# Patient Record
Sex: Female | Born: 1935 | Race: White | Hispanic: No | Smoking: Never smoker
Health system: Southern US, Community
[De-identification: ages and names within clinical notes are randomized; demographics above are authoritative.]

## PROBLEM LIST (undated history)

## (undated) DIAGNOSIS — M810 Age-related osteoporosis without current pathological fracture: Secondary | ICD-10-CM

## (undated) DIAGNOSIS — I071 Rheumatic tricuspid insufficiency: Secondary | ICD-10-CM

## (undated) DIAGNOSIS — I340 Nonrheumatic mitral (valve) insufficiency: Secondary | ICD-10-CM

## (undated) DIAGNOSIS — E7800 Pure hypercholesterolemia, unspecified: Secondary | ICD-10-CM

## (undated) DIAGNOSIS — M2550 Pain in unspecified joint: Secondary | ICD-10-CM

## (undated) DIAGNOSIS — I350 Nonrheumatic aortic (valve) stenosis: Secondary | ICD-10-CM

## (undated) DIAGNOSIS — I351 Nonrheumatic aortic (valve) insufficiency: Secondary | ICD-10-CM

## (undated) DIAGNOSIS — I4891 Unspecified atrial fibrillation: Secondary | ICD-10-CM

## (undated) DIAGNOSIS — I10 Essential (primary) hypertension: Secondary | ICD-10-CM

## (undated) HISTORY — DX: Pure hypercholesterolemia, unspecified: E78.00

## (undated) HISTORY — DX: Pain in unspecified joint: M25.50

## (undated) HISTORY — DX: Nonrheumatic aortic (valve) stenosis: I35.0

## (undated) HISTORY — DX: Nonrheumatic mitral (valve) insufficiency: I34.0

## (undated) HISTORY — DX: Age-related osteoporosis without current pathological fracture: M81.0

## (undated) HISTORY — DX: Unspecified atrial fibrillation: I48.91

## (undated) HISTORY — DX: Nonrheumatic aortic (valve) insufficiency: I35.1

## (undated) HISTORY — DX: Essential (primary) hypertension: I10

## (undated) HISTORY — DX: Rheumatic tricuspid insufficiency: I07.1

---

## 1998-03-29 ENCOUNTER — Other Ambulatory Visit: Admission: RE | Admit: 1998-03-29 | Discharge: 1998-03-29 | Payer: Self-pay | Admitting: Obstetrics and Gynecology

## 1998-07-15 ENCOUNTER — Other Ambulatory Visit: Admission: RE | Admit: 1998-07-15 | Discharge: 1998-07-15 | Payer: Self-pay | Admitting: Family Medicine

## 1999-09-08 ENCOUNTER — Encounter: Payer: Self-pay | Admitting: Family Medicine

## 1999-09-08 ENCOUNTER — Encounter: Admission: RE | Admit: 1999-09-08 | Discharge: 1999-09-08 | Payer: Self-pay | Admitting: Family Medicine

## 1999-09-12 ENCOUNTER — Other Ambulatory Visit: Admission: RE | Admit: 1999-09-12 | Discharge: 1999-09-12 | Payer: Self-pay | Admitting: Family Medicine

## 1999-10-12 ENCOUNTER — Encounter: Admission: RE | Admit: 1999-10-12 | Discharge: 1999-10-12 | Payer: Self-pay | Admitting: Family Medicine

## 1999-10-12 ENCOUNTER — Encounter: Payer: Self-pay | Admitting: Family Medicine

## 2000-05-31 ENCOUNTER — Emergency Department (HOSPITAL_COMMUNITY): Admission: EM | Admit: 2000-05-31 | Discharge: 2000-05-31 | Payer: Self-pay

## 2000-05-31 ENCOUNTER — Encounter: Payer: Self-pay | Admitting: Emergency Medicine

## 2000-09-10 ENCOUNTER — Encounter: Payer: Self-pay | Admitting: Family Medicine

## 2000-09-10 ENCOUNTER — Encounter: Admission: RE | Admit: 2000-09-10 | Discharge: 2000-09-10 | Payer: Self-pay | Admitting: Family Medicine

## 2000-09-12 ENCOUNTER — Other Ambulatory Visit: Admission: RE | Admit: 2000-09-12 | Discharge: 2000-09-12 | Payer: Self-pay | Admitting: Family Medicine

## 2001-09-11 ENCOUNTER — Encounter: Admission: RE | Admit: 2001-09-11 | Discharge: 2001-09-11 | Payer: Self-pay | Admitting: Family Medicine

## 2001-09-11 ENCOUNTER — Encounter: Payer: Self-pay | Admitting: Family Medicine

## 2001-09-25 ENCOUNTER — Other Ambulatory Visit: Admission: RE | Admit: 2001-09-25 | Discharge: 2001-09-25 | Payer: Self-pay | Admitting: Family Medicine

## 2001-10-21 ENCOUNTER — Encounter: Admission: RE | Admit: 2001-10-21 | Discharge: 2001-10-21 | Payer: Self-pay | Admitting: Family Medicine

## 2001-10-21 ENCOUNTER — Encounter: Payer: Self-pay | Admitting: Family Medicine

## 2002-09-15 ENCOUNTER — Encounter: Admission: RE | Admit: 2002-09-15 | Discharge: 2002-09-15 | Payer: Self-pay | Admitting: Family Medicine

## 2002-09-15 ENCOUNTER — Encounter: Payer: Self-pay | Admitting: Family Medicine

## 2002-09-29 ENCOUNTER — Other Ambulatory Visit: Admission: RE | Admit: 2002-09-29 | Discharge: 2002-09-29 | Payer: Self-pay | Admitting: Family Medicine

## 2002-11-06 ENCOUNTER — Ambulatory Visit (HOSPITAL_COMMUNITY): Admission: RE | Admit: 2002-11-06 | Discharge: 2002-11-06 | Payer: Self-pay | Admitting: Gastroenterology

## 2002-11-06 ENCOUNTER — Encounter (INDEPENDENT_AMBULATORY_CARE_PROVIDER_SITE_OTHER): Payer: Self-pay | Admitting: Specialist

## 2003-05-31 ENCOUNTER — Ambulatory Visit (HOSPITAL_BASED_OUTPATIENT_CLINIC_OR_DEPARTMENT_OTHER): Admission: RE | Admit: 2003-05-31 | Discharge: 2003-05-31 | Payer: Self-pay | Admitting: Family Medicine

## 2003-06-08 ENCOUNTER — Ambulatory Visit (HOSPITAL_COMMUNITY): Admission: RE | Admit: 2003-06-08 | Discharge: 2003-06-08 | Payer: Self-pay | Admitting: *Deleted

## 2003-09-16 ENCOUNTER — Encounter: Admission: RE | Admit: 2003-09-16 | Discharge: 2003-09-16 | Payer: Self-pay | Admitting: Family Medicine

## 2003-10-19 ENCOUNTER — Other Ambulatory Visit: Admission: RE | Admit: 2003-10-19 | Discharge: 2003-10-19 | Payer: Self-pay | Admitting: Family Medicine

## 2004-03-23 ENCOUNTER — Encounter: Admission: RE | Admit: 2004-03-23 | Discharge: 2004-05-23 | Payer: Self-pay | Admitting: Family Medicine

## 2004-09-21 ENCOUNTER — Encounter: Admission: RE | Admit: 2004-09-21 | Discharge: 2004-09-21 | Payer: Self-pay | Admitting: Family Medicine

## 2004-10-19 ENCOUNTER — Other Ambulatory Visit: Admission: RE | Admit: 2004-10-19 | Discharge: 2004-10-19 | Payer: Self-pay | Admitting: Family Medicine

## 2005-05-31 ENCOUNTER — Encounter: Admission: RE | Admit: 2005-05-31 | Discharge: 2005-06-14 | Payer: Self-pay | Admitting: Family Medicine

## 2005-09-25 ENCOUNTER — Encounter: Admission: RE | Admit: 2005-09-25 | Discharge: 2005-09-25 | Payer: Self-pay | Admitting: Family Medicine

## 2005-11-21 ENCOUNTER — Ambulatory Visit (HOSPITAL_COMMUNITY): Admission: RE | Admit: 2005-11-21 | Discharge: 2005-11-22 | Payer: Self-pay | Admitting: Orthopedic Surgery

## 2006-03-27 ENCOUNTER — Other Ambulatory Visit: Admission: RE | Admit: 2006-03-27 | Discharge: 2006-03-27 | Payer: Self-pay | Admitting: Family Medicine

## 2006-10-22 ENCOUNTER — Encounter: Admission: RE | Admit: 2006-10-22 | Discharge: 2006-10-22 | Payer: Self-pay | Admitting: Family Medicine

## 2007-10-28 ENCOUNTER — Encounter: Admission: RE | Admit: 2007-10-28 | Discharge: 2007-10-28 | Payer: Self-pay | Admitting: Family Medicine

## 2008-11-03 ENCOUNTER — Encounter: Admission: RE | Admit: 2008-11-03 | Discharge: 2008-11-03 | Payer: Self-pay | Admitting: Family Medicine

## 2008-12-05 ENCOUNTER — Emergency Department (HOSPITAL_COMMUNITY): Admission: EM | Admit: 2008-12-05 | Discharge: 2008-12-05 | Payer: Self-pay | Admitting: Emergency Medicine

## 2009-04-12 ENCOUNTER — Encounter: Admission: RE | Admit: 2009-04-12 | Discharge: 2009-04-12 | Payer: Self-pay | Admitting: Family Medicine

## 2009-11-10 ENCOUNTER — Encounter: Admission: RE | Admit: 2009-11-10 | Discharge: 2009-11-10 | Payer: Self-pay | Admitting: Family Medicine

## 2010-07-11 ENCOUNTER — Other Ambulatory Visit
Admission: RE | Admit: 2010-07-11 | Discharge: 2010-07-11 | Payer: Self-pay | Source: Home / Self Care | Admitting: Family Medicine

## 2010-10-17 ENCOUNTER — Other Ambulatory Visit: Payer: Self-pay | Admitting: Family Medicine

## 2010-10-17 DIAGNOSIS — Z1231 Encounter for screening mammogram for malignant neoplasm of breast: Secondary | ICD-10-CM

## 2010-11-15 ENCOUNTER — Ambulatory Visit
Admission: RE | Admit: 2010-11-15 | Discharge: 2010-11-15 | Disposition: A | Discharge status: Home / Self Care | Payer: Medicare Other | Source: Ambulatory Visit | Attending: Family Medicine | Admitting: Family Medicine

## 2010-11-15 DIAGNOSIS — Z1231 Encounter for screening mammogram for malignant neoplasm of breast: Secondary | ICD-10-CM

## 2010-11-18 NOTE — Op Note (Signed)
   NAME:  Stephanie [name], Stephanie [name]                           ACCOUNT NO.:  [id number]   MEDICAL RECORD NO.:  [id number]                   PATIENT TYPE:  AMB   LOCATION:  ENDO                                 FACILITY:  [facility]   PHYSICIAN:  Graylin Shiver, M.D.                DATE OF BIRTH:  [birth year]   DATE OF PROCEDURE:  11/06/2002  DATE OF DISCHARGE:                                 OPERATIVE REPORT   PROCEDURE:  Colonoscopy with polypectomy.   INDICATIONS FOR PROCEDURE:  Change in bowel habits.   INFORMED CONSENT:  Informed consent was obtained.   PREMEDICATIONS:  1. Fentanyl 75 mcg IV.  2. Versed 6 mg IV.   DESCRIPTION OF PROCEDURE:  With the patient in the left lateral decubitus  position, a rectal exam was performed, and no masses were felt.  The Olympus  colonoscope was inserted into the rectum and advanced around a tortuous  colon to the cecum.  The cecal landmarks were identified.  The cecum and  ascending colon were normal.  The transverse colon was normal.  In the mid  and proximal descending colon, there were two small 5 mm sessile polyps  which were removed by snare cautery technique.  The cautery sites looked  good.  The polyps were retrieved.  The sigmoid was very tortuous.  The  rectum was normal.  She tolerated the procedure well without complications.   IMPRESSION:  Two small colon polyps in the descending colon.   PLAN:  1. The pathology will be checked and further decisions made after reviewing     with pathology.  2. In regard to the patient's change in bowel habits, I am going to     recommend that she try a bulk agent such as Metamucil or Citrucel and try     a high fiber diet.                                               Graylin Shiver, M.D.    Germain Osgood  D:  11/06/2002  T:  11/06/2002  Job:  [id number]   cc:   Chales Salmon. Abigail Miyamoto, M.D.  [address]  [place]  [place] [address]  Fax: [phone]

## 2010-11-18 NOTE — Op Note (Signed)
Stephanie [name], MUCHA NO.:  [id number]   MEDICAL RECORD NO.:  [id number]          PATIENT TYPE:  OIB   LOCATION:  0098                         FACILITY:  [facility]   PHYSICIAN:  Deidre Ala, M.D.    DATE OF BIRTH:  03/17/1924   DATE OF PROCEDURE:  11/21/2005  DATE OF DISCHARGE:                                 OPERATIVE REPORT   PREOPERATIVE DIAGNOSIS:  Displaced three-part proximal humeral surgical neck  fracture with tuberosity component nondisplaced.   POSTOPERATIVE DIAGNOSIS:  Displaced three-part proximal humeral surgical  neck fracture with tuberosity component nondisplaced, with displacement of  shaft on humeral head medially.   PROCEDURE:  1.  A closed manipulative reduction of right proximal humeral fracture with      cross fitted Steinmann pinning.  2.  The use of intraoperative fluoroscopic control.   SURGEON:  1.  Charlesetta Shanks, MD.   ASSISTANT:  __________, PA-C.   ANESTHESIA:  General endotracheal.   CULTURES:  None.   DRAINS:  None.   ESTIMATED BLOOD LOSS:  Minimal.   PATHOLOGIC FINDINGS AND HISTORY:  Iyona Rotter fell climbing some steps October 27, 2005.  She had a nondisplaced humeral neck fracture.  She then was  placed in a sling and followed up in three weeks.  At three weeks time, it  was noted that the humeral shaft had migrated medial with respect to the  head followed by over-pull of the pectoralis.  It was then elected to  proceed with this surgical intervention.  At surgery, we did counter  traction across the chest with counter traction laterally in the axilla as  well as longitudinal traction and varus as she had fallen to the sub-valgus.  We got a satisfactory reduction and placed two threaded Steinmann pins, 2.8,  from superior, inferior down the shaft and from inferior in the shaft up  into the head with some divergence.  C-arm fluoroscopy confirmed position on  AP and lateral view with correction of her valgus.  She then had  a sling  placed and was to be sent home for outpatient routine.   PROCEDURE:  With adequate anesthesia obtained using endotracheal technique,  1 gm Ancef given IV prophylaxis, the patient was placed in the supine/beach  chair position on the shoulder table.  We then manipulated the shoulder and  set the C-arm so as to make sure we could get the appropriate views and did  get a satisfactory initial reduction.  The shoulder was then prepped and  draped in the standard fashion.  We then re-reduced the shoulder with  traction and manipulation of the shaft laterally and in through some  __________ , reducing the shaft underneath the humeral head on both views.  I then placed two K-wires from the tuberosity area down across into the  shaft, and two K-wires from the shaft up into the head.  AP and lateral view  confirmed positioning without hip penetration with some divergence.  A  satisfactory fracture alignment was carried out.  The patient then had the  pins cut and capped  with dressings changed, and we did release the skin  around the skin by the pin sites to allow for drainage.  A compressive  dressing was applied with Ace and the sling reapplied.  We did place 20 ml  of 0.5% Marcaine plain next to the bone where the pins and skin had been  penetrated.  The dressing had been applied as above with a sling.  The  patient then having tolerated the procedure well was awakened and taken to  the recovery room in satisfactory condition to be discharged for outpatient  routine.           ______________________________  V. Charlesetta Shanks, M.D.     VEP/MEDQ  D:  11/21/2005  T:  11/21/2005  Job:  [id number]   cc:   Gretta Arab. Valentina Lucks, M.D.  Fax: [phone]

## 2011-01-27 IMAGING — CT CT HEAD W/O CM
1 series · 16 of 30 positions shown, 20 images · non-contrast
Comparison: Report dated 05/31/2000.

CLINICAL DATA: Posterior headache.  Nausea.  Hypertension.

CT HEAD WITHOUT CONTRAST
TECHNIQUE: Contiguous axial images were obtained from the base of
the skull through the vertex without contrast.

[Series 2: headseq 4.8 h45s · axial · 0.43mm/px · z∈[-195,-64]mm · 16 of 30 slices shown, 20 images]
[im 2/30  brain]
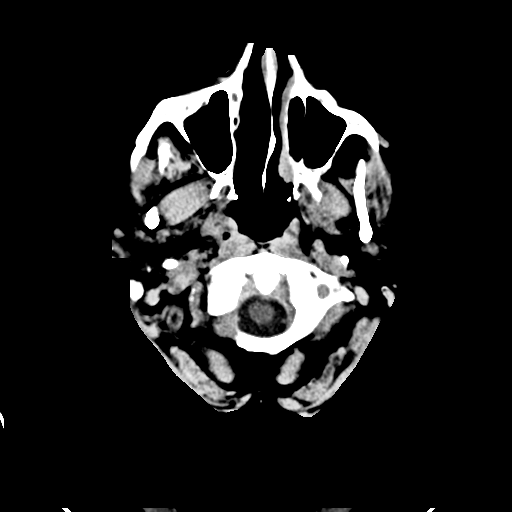
[im 2/30  bone]
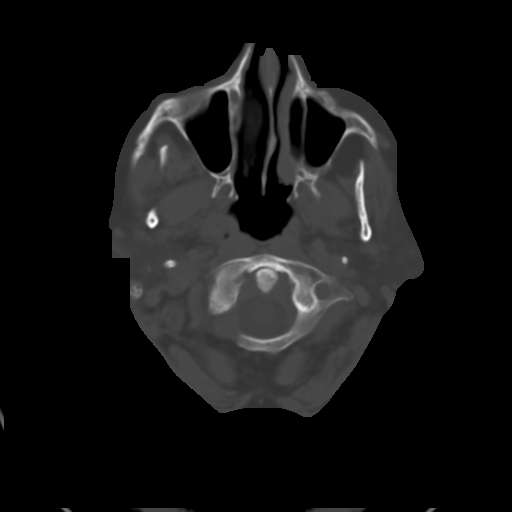
[im 4/30  brain]
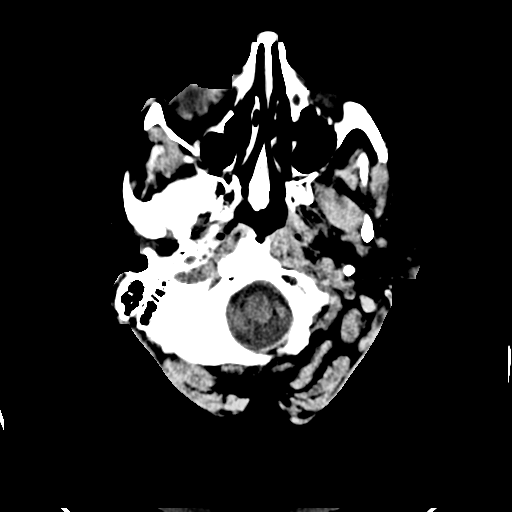
[im 6/30  brain]
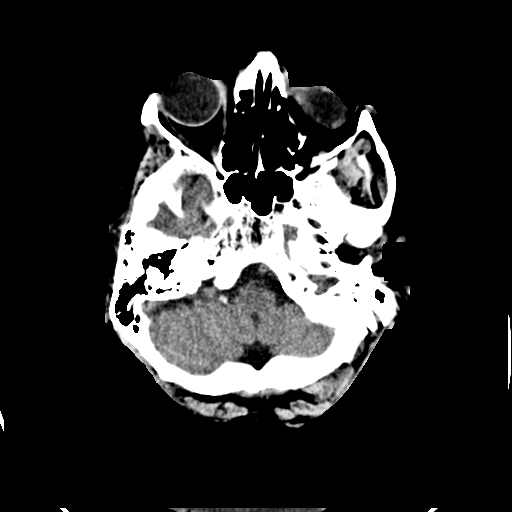
[im 8/30  brain]
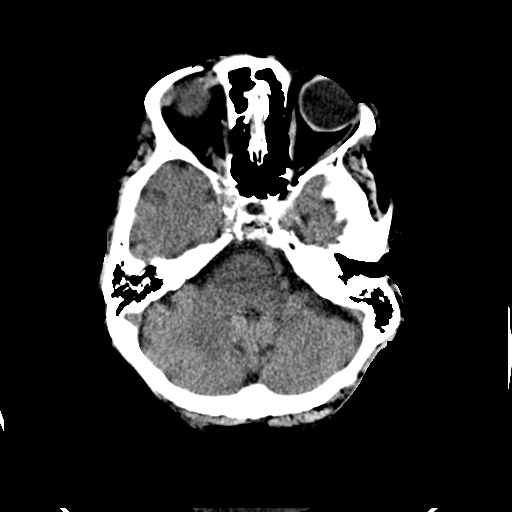
[im 9/30  brain]
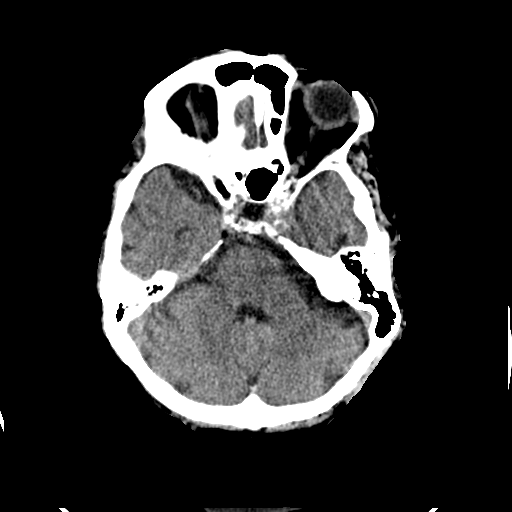
[im 9/30  bone]
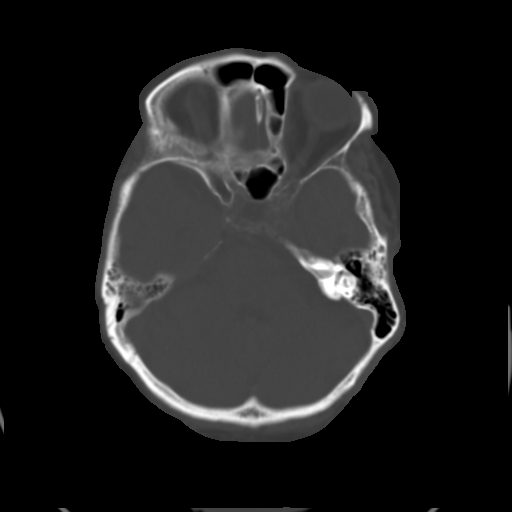
[im 11/30  brain]
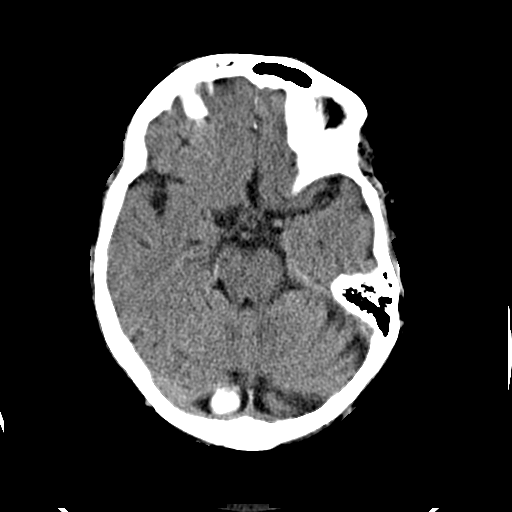
[im 13/30  brain]
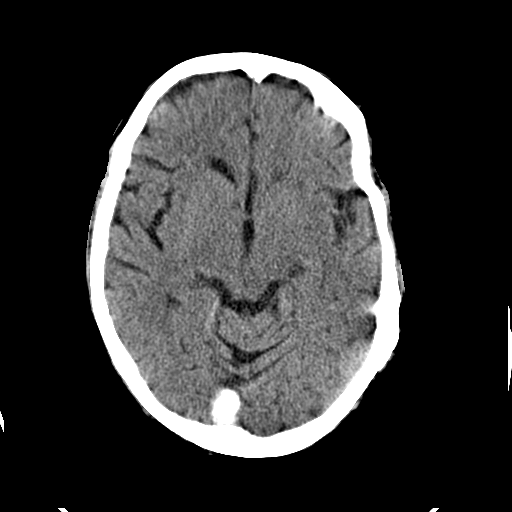
[im 15/30  brain]
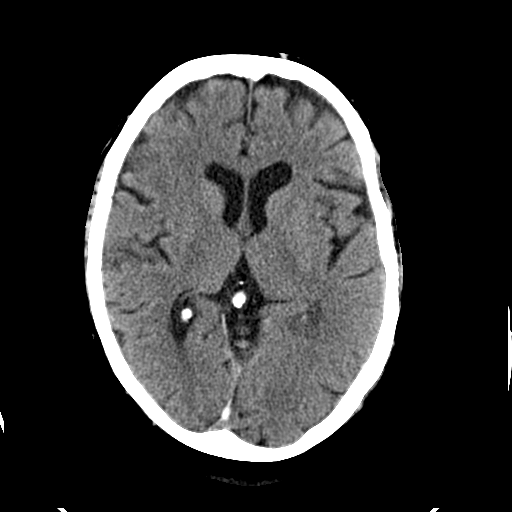
[im 16/30  brain]
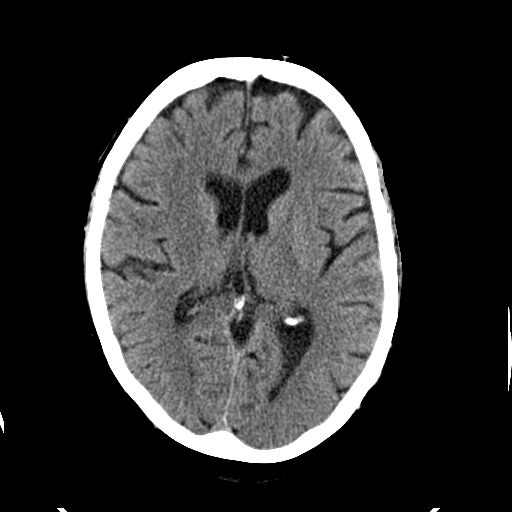
[im 16/30  bone]
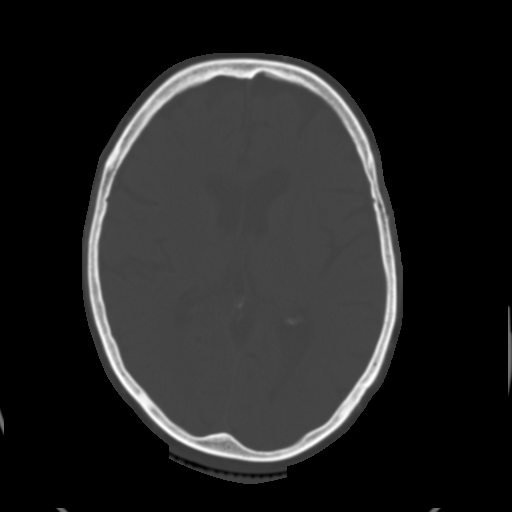
[im 18/30  brain]
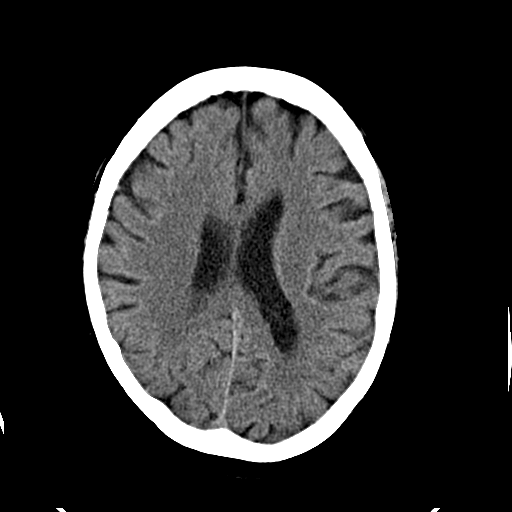
[im 20/30  brain]
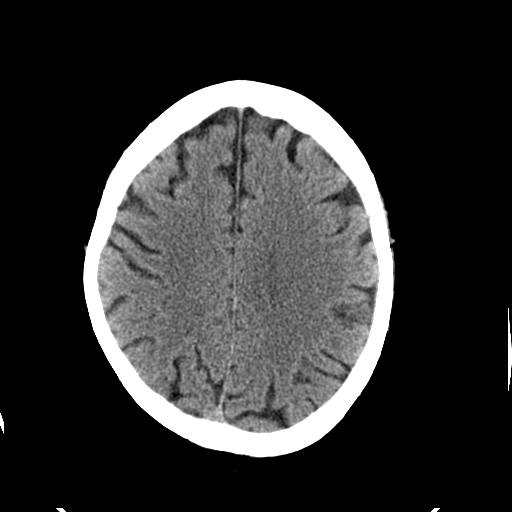
[im 22/30  brain]
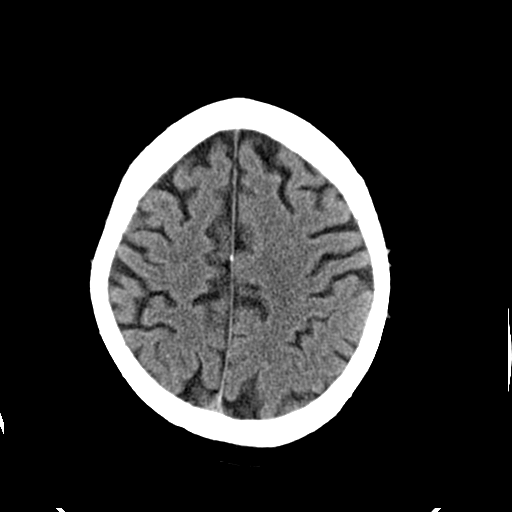
[im 23/30  brain]
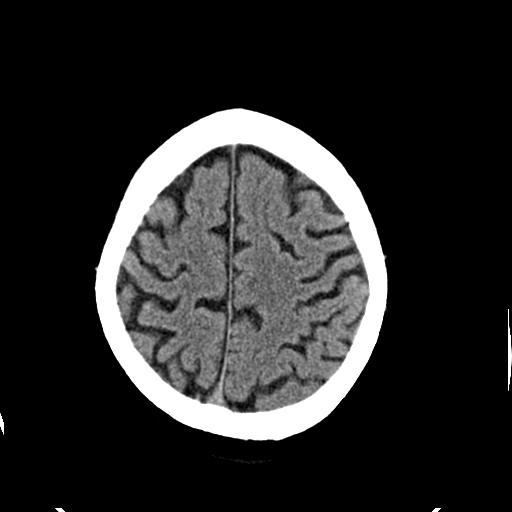
[im 23/30  bone]
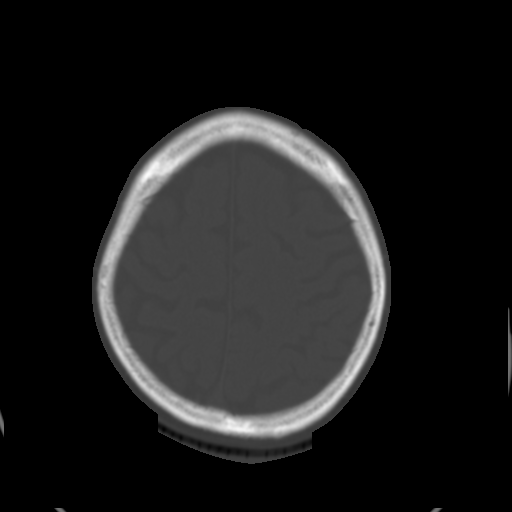
[im 25/30  brain]
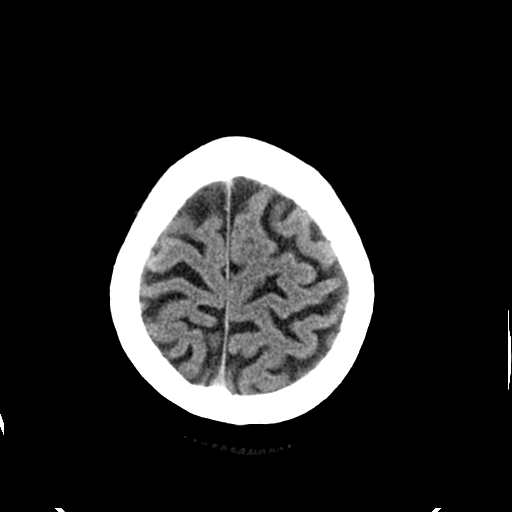
[im 27/30  brain]
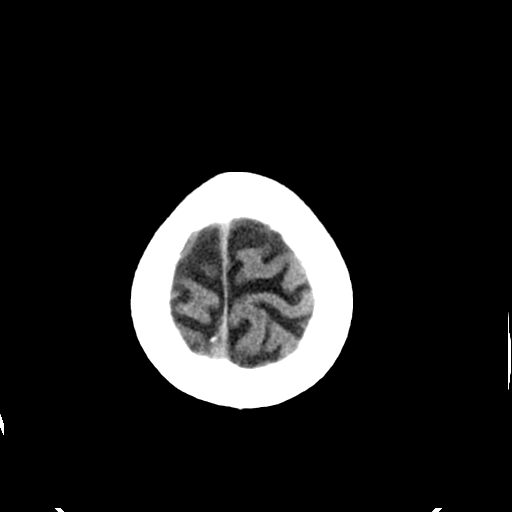
[im 29/30  brain]
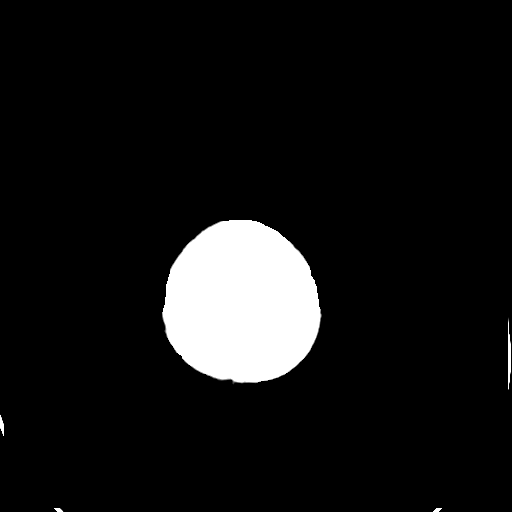

[16 of 30 positions shown; findings below may reference images not displayed]

FINDINGS: 1.6 x 1.4 cm rounded mass with dense calcifications at
the posteromedial aspect of the right cerebellar hemisphere.  This
is extra-axial in location and unchanged in size, by report.
Mildly enlarged ventricles and subarachnoid spaces.  No
intracranial hemorrhage or CT evidence of acute infarction.
Unremarkable bones and included paranasal sinuses.
IMPRESSION: 1.  Stable densely calcified meningioma in the right posterior
fossa.
2.  Mild diffuse cerebral and cerebellar atrophy.
3.  No acute abnormality.

## 2011-07-18 ENCOUNTER — Other Ambulatory Visit: Payer: Self-pay | Admitting: Family Medicine

## 2011-07-18 DIAGNOSIS — M810 Age-related osteoporosis without current pathological fracture: Secondary | ICD-10-CM | POA: Diagnosis not present

## 2011-07-18 DIAGNOSIS — E7800 Pure hypercholesterolemia, unspecified: Secondary | ICD-10-CM | POA: Diagnosis not present

## 2011-07-18 DIAGNOSIS — M79609 Pain in unspecified limb: Secondary | ICD-10-CM | POA: Diagnosis not present

## 2011-07-18 DIAGNOSIS — I10 Essential (primary) hypertension: Secondary | ICD-10-CM | POA: Diagnosis not present

## 2011-07-18 DIAGNOSIS — Z0000 Encounter for general adult medical examination without abnormal findings: Secondary | ICD-10-CM | POA: Diagnosis not present

## 2011-07-18 DIAGNOSIS — Z79899 Other long term (current) drug therapy: Secondary | ICD-10-CM | POA: Diagnosis not present

## 2011-08-16 ENCOUNTER — Ambulatory Visit
Admission: RE | Admit: 2011-08-16 | Discharge: 2011-08-16 | Disposition: A | Discharge status: Home / Self Care | Payer: Medicare Other | Source: Ambulatory Visit | Attending: Family Medicine | Admitting: Family Medicine

## 2011-08-16 DIAGNOSIS — M899 Disorder of bone, unspecified: Secondary | ICD-10-CM | POA: Diagnosis not present

## 2011-08-16 DIAGNOSIS — M949 Disorder of cartilage, unspecified: Secondary | ICD-10-CM | POA: Diagnosis not present

## 2011-08-16 DIAGNOSIS — Z780 Asymptomatic menopausal state: Secondary | ICD-10-CM | POA: Diagnosis not present

## 2011-09-26 DIAGNOSIS — B354 Tinea corporis: Secondary | ICD-10-CM | POA: Diagnosis not present

## 2011-10-16 ENCOUNTER — Other Ambulatory Visit: Payer: Self-pay | Admitting: Family Medicine

## 2011-10-16 DIAGNOSIS — Z1231 Encounter for screening mammogram for malignant neoplasm of breast: Secondary | ICD-10-CM

## 2011-11-16 ENCOUNTER — Ambulatory Visit
Admission: RE | Admit: 2011-11-16 | Discharge: 2011-11-16 | Disposition: A | Discharge status: Home / Self Care | Payer: Medicare Other | Source: Ambulatory Visit | Attending: Family Medicine | Admitting: Family Medicine

## 2011-11-16 DIAGNOSIS — Z1231 Encounter for screening mammogram for malignant neoplasm of breast: Secondary | ICD-10-CM

## 2011-12-18 DIAGNOSIS — Z961 Presence of intraocular lens: Secondary | ICD-10-CM | POA: Diagnosis not present

## 2012-04-19 DIAGNOSIS — Z23 Encounter for immunization: Secondary | ICD-10-CM | POA: Diagnosis not present

## 2012-05-24 DIAGNOSIS — L03039 Cellulitis of unspecified toe: Secondary | ICD-10-CM | POA: Diagnosis not present

## 2012-05-24 DIAGNOSIS — L02619 Cutaneous abscess of unspecified foot: Secondary | ICD-10-CM | POA: Diagnosis not present

## 2012-06-10 DIAGNOSIS — I10 Essential (primary) hypertension: Secondary | ICD-10-CM | POA: Diagnosis not present

## 2012-06-10 DIAGNOSIS — E7800 Pure hypercholesterolemia, unspecified: Secondary | ICD-10-CM | POA: Diagnosis not present

## 2012-06-10 DIAGNOSIS — I4891 Unspecified atrial fibrillation: Secondary | ICD-10-CM | POA: Diagnosis not present

## 2012-06-10 DIAGNOSIS — R609 Edema, unspecified: Secondary | ICD-10-CM | POA: Diagnosis not present

## 2012-07-29 DIAGNOSIS — M810 Age-related osteoporosis without current pathological fracture: Secondary | ICD-10-CM | POA: Diagnosis not present

## 2012-07-29 DIAGNOSIS — E7800 Pure hypercholesterolemia, unspecified: Secondary | ICD-10-CM | POA: Diagnosis not present

## 2012-07-29 DIAGNOSIS — R5381 Other malaise: Secondary | ICD-10-CM | POA: Diagnosis not present

## 2012-07-29 DIAGNOSIS — Z1331 Encounter for screening for depression: Secondary | ICD-10-CM | POA: Diagnosis not present

## 2012-07-29 DIAGNOSIS — I10 Essential (primary) hypertension: Secondary | ICD-10-CM | POA: Diagnosis not present

## 2012-07-29 DIAGNOSIS — Z0000 Encounter for general adult medical examination without abnormal findings: Secondary | ICD-10-CM | POA: Diagnosis not present

## 2012-07-29 DIAGNOSIS — Z79899 Other long term (current) drug therapy: Secondary | ICD-10-CM | POA: Diagnosis not present

## 2012-07-29 DIAGNOSIS — R7301 Impaired fasting glucose: Secondary | ICD-10-CM | POA: Diagnosis not present

## 2012-08-27 DIAGNOSIS — J309 Allergic rhinitis, unspecified: Secondary | ICD-10-CM | POA: Diagnosis not present

## 2012-10-16 ENCOUNTER — Other Ambulatory Visit: Payer: Self-pay

## 2012-10-16 DIAGNOSIS — Z1231 Encounter for screening mammogram for malignant neoplasm of breast: Secondary | ICD-10-CM

## 2012-11-26 ENCOUNTER — Ambulatory Visit
Admission: RE | Admit: 2012-11-26 | Discharge: 2012-11-26 | Disposition: A | Discharge status: Home / Self Care | Payer: Medicare Other | Source: Ambulatory Visit

## 2012-11-26 ENCOUNTER — Ambulatory Visit: Payer: Medicare Other

## 2012-11-26 DIAGNOSIS — Z1231 Encounter for screening mammogram for malignant neoplasm of breast: Secondary | ICD-10-CM | POA: Diagnosis not present

## 2013-01-28 DIAGNOSIS — I10 Essential (primary) hypertension: Secondary | ICD-10-CM | POA: Diagnosis not present

## 2013-01-28 DIAGNOSIS — Z79899 Other long term (current) drug therapy: Secondary | ICD-10-CM | POA: Diagnosis not present

## 2013-01-28 DIAGNOSIS — R7301 Impaired fasting glucose: Secondary | ICD-10-CM | POA: Diagnosis not present

## 2013-01-28 DIAGNOSIS — E7800 Pure hypercholesterolemia, unspecified: Secondary | ICD-10-CM | POA: Diagnosis not present

## 2013-02-25 DIAGNOSIS — L02619 Cutaneous abscess of unspecified foot: Secondary | ICD-10-CM | POA: Diagnosis not present

## 2013-04-28 DIAGNOSIS — Z23 Encounter for immunization: Secondary | ICD-10-CM | POA: Diagnosis not present

## 2013-06-09 ENCOUNTER — Encounter: Payer: Self-pay | Admitting: *Deleted

## 2013-06-09 ENCOUNTER — Encounter: Payer: Self-pay | Admitting: Interventional Cardiology

## 2013-06-09 DIAGNOSIS — I10 Essential (primary) hypertension: Secondary | ICD-10-CM | POA: Insufficient documentation

## 2013-06-09 DIAGNOSIS — M810 Age-related osteoporosis without current pathological fracture: Secondary | ICD-10-CM | POA: Insufficient documentation

## 2013-06-09 DIAGNOSIS — I4891 Unspecified atrial fibrillation: Secondary | ICD-10-CM | POA: Insufficient documentation

## 2013-06-09 DIAGNOSIS — M2550 Pain in unspecified joint: Secondary | ICD-10-CM | POA: Insufficient documentation

## 2013-06-09 DIAGNOSIS — E7800 Pure hypercholesterolemia, unspecified: Secondary | ICD-10-CM | POA: Insufficient documentation

## 2013-06-10 ENCOUNTER — Encounter: Payer: Self-pay | Admitting: Interventional Cardiology

## 2013-06-10 ENCOUNTER — Ambulatory Visit (INDEPENDENT_AMBULATORY_CARE_PROVIDER_SITE_OTHER): Payer: Medicare Other | Admitting: Interventional Cardiology

## 2013-06-10 VITALS — BP 122/74 | HR 63 | Ht 59.0 in | Wt 153.8 lb

## 2013-06-10 DIAGNOSIS — E7800 Pure hypercholesterolemia, unspecified: Secondary | ICD-10-CM

## 2013-06-10 DIAGNOSIS — R609 Edema, unspecified: Secondary | ICD-10-CM | POA: Diagnosis not present

## 2013-06-10 DIAGNOSIS — I10 Essential (primary) hypertension: Secondary | ICD-10-CM

## 2013-06-10 DIAGNOSIS — I4891 Unspecified atrial fibrillation: Secondary | ICD-10-CM

## 2013-06-10 NOTE — Patient Instructions (Signed)
Your physician wants you to follow-up in: 1 year with Dr. Varanasi. You will receive a reminder letter in the mail two months in advance. If you don't receive a letter, please call our office to schedule the follow-up appointment.  Your physician recommends that you continue on your current medications as directed. Please refer to the Current Medication list given to you today.  

## 2013-06-10 NOTE — Progress Notes (Signed)
Patient ID: Stephanie [name], female   DOB: 07-Sep-1923, 79 y.o.   MRN: [id number]    [address] [place], [place]  [address] Phone: [phone] Fax:  [phone]  Date:  06/10/2013   ID:  Stephanie [name], DOB 02/09/1924, MRN [id number]  PCP:  No primary provider on file.      History of Present Illness: Stephanie [name] is a 79 y.o. female with HTN, AFib and high cholesterol. She has been feeling well. She walks very occasionally, only due to laziness per her report. She does not do it regularly. BP checked weekly and is normal. Did nbot feel AFib in the past. Atrial Fibrillation F/U:  c/o Leg edema unchanged.  c/o Shortness of breath exacerbated by activity, stairs.  Denies : Chest pain.  Dizziness.  Orthopnea.  Palpitations.  Syncope.  does not exercise much.    Wt Readings from Last 3 Encounters:  06/10/13 153 lb 12.8 oz (69.763 kg)     Past Medical History  Diagnosis Date  . HTN (hypertension)   . Hypercholesteremia   . Pain in joint   . Atrial fibrillation   . Osteoporosis     Current Outpatient Prescriptions  Medication Sig Dispense Refill  . aspirin 325 MG tablet Take 325 mg by mouth daily.      Marland Kitchen atorvastatin (LIPITOR) 10 MG tablet Take 10 mg by mouth. 1/2 tablet daily      . calcium-vitamin D 250-100 MG-UNIT per tablet Take 1 tablet by mouth 2 (two) times daily.      . hydrochlorothiazide (HYDRODIURIL) 25 MG tablet Take 25 mg by mouth daily.      . metoprolol succinate (TOPROL-XL) 25 MG 24 hr tablet Take 25 mg by mouth daily.      . Multiple Vitamin (MULTIVITAMIN) capsule Take 1 capsule by mouth daily.      . Multiple Vitamins-Minerals (CENTRUM SILVER PO) Take by mouth.      . NON FORMULARY Stool softner      . tolterodine (DETROL) 2 MG tablet Take 2 mg by mouth 2 (two) times daily.       No current facility-administered medications for this visit.    Allergies:   No Known Allergies  Social History:  The patient  reports that she has never  smoked. She does not have any smokeless tobacco history on file.   Family History:  The patient's Family history is unknown by patient.   ROS:  Please see the history of present illness.  No nausea, vomiting.  No fevers, chills.  No focal weakness.  No dysuria. Joint pains   All other systems reviewed and negative.   PHYSICAL EXAM: VS:  BP 122/74  Pulse 63  Ht 4\' 11"  (1.499 m)  Wt 153 lb 12.8 oz (69.763 kg)  BMI 31.05 kg/m2 Well nourished, well developed, in no acute distress HEENT: normal Neck: no JVD, no carotid bruits Cardiac:  normal S1, S2; RRR; 2/6 systolic murmur Lungs:  clear to auscultation bilaterally, no wheezing, rhonchi or rales Abd: soft, nontender, no hepatomegaly Ext: no edema Skin: warm and dry Neuro:   no focal abnormalities noted  EKG:  Normal     ASSESSMENT AND PLAN:  Atrial fibrillation  Continue Metoprolol Succinate Tablet Extended Release 24 Hour, 25 MG, 1 tablet, Orally, Once a day, 90 days, 90, Refills 3 Continue Aspirin 325 mg tablet, 325 mg, one tab, orally, daily Diagnostic Imaging:EKG Harward,Amy 06/10/2012 11:06:45 AM > Savanna Dooley,JAY 06/10/2012 11:27:05 AM >  NSR, no ST segment changes  Maintaining sinus rhythm. CONtinue beta blocker. We discussed Coumadin and Eliquis again but she is not interested. I think this is reasonable. AFib came up on a stress test many years ago.  No regular palpitations.   2. Hypertension, essential  Continue Hydrochlorothiazide Tablet, 25 MG, 1 tablet, Orally, Once a day Well controlled. Continue current medicines. Increase walking. Decrease salt intake and check BP at home. If top number stays > 140 consistently, let us know and we can increase medication.  3. Hypercholesterolemia  Continue Lipitor Tablet, 10 MG, 1/2 tab, Orally, Once a day LDL 72 in 2011. Lipids controlled in Jan 2013.  LDL 74 in 2014 July.  4. Bilateral leg edema  Prop up legs on a few pillows at night. She has compression stockings and can use those.  If these do not work, could consider switch to Lasix from HCTZ.    Signed, Fredric Mare, MD, [facility] 06/10/2013 11:26 AM

## 2013-06-18 DIAGNOSIS — Z961 Presence of intraocular lens: Secondary | ICD-10-CM | POA: Diagnosis not present

## 2013-07-31 ENCOUNTER — Other Ambulatory Visit: Payer: Self-pay | Admitting: Family Medicine

## 2013-07-31 DIAGNOSIS — Z1331 Encounter for screening for depression: Secondary | ICD-10-CM | POA: Diagnosis not present

## 2013-07-31 DIAGNOSIS — N6320 Unspecified lump in the left breast, unspecified quadrant: Secondary | ICD-10-CM

## 2013-07-31 DIAGNOSIS — I10 Essential (primary) hypertension: Secondary | ICD-10-CM | POA: Diagnosis not present

## 2013-07-31 DIAGNOSIS — G479 Sleep disorder, unspecified: Secondary | ICD-10-CM | POA: Diagnosis not present

## 2013-07-31 DIAGNOSIS — N6321 Unspecified lump in the left breast, upper outer quadrant: Secondary | ICD-10-CM

## 2013-07-31 DIAGNOSIS — M810 Age-related osteoporosis without current pathological fracture: Secondary | ICD-10-CM | POA: Diagnosis not present

## 2013-07-31 DIAGNOSIS — R7301 Impaired fasting glucose: Secondary | ICD-10-CM | POA: Diagnosis not present

## 2013-07-31 DIAGNOSIS — N63 Unspecified lump in unspecified breast: Secondary | ICD-10-CM | POA: Diagnosis not present

## 2013-07-31 DIAGNOSIS — R9389 Abnormal findings on diagnostic imaging of other specified body structures: Secondary | ICD-10-CM | POA: Diagnosis not present

## 2013-07-31 DIAGNOSIS — Z0000 Encounter for general adult medical examination without abnormal findings: Secondary | ICD-10-CM | POA: Diagnosis not present

## 2013-07-31 DIAGNOSIS — Z23 Encounter for immunization: Secondary | ICD-10-CM | POA: Diagnosis not present

## 2013-07-31 DIAGNOSIS — E7800 Pure hypercholesterolemia, unspecified: Secondary | ICD-10-CM | POA: Diagnosis not present

## 2013-07-31 DIAGNOSIS — Z79899 Other long term (current) drug therapy: Secondary | ICD-10-CM | POA: Diagnosis not present

## 2013-08-08 DIAGNOSIS — N952 Postmenopausal atrophic vaginitis: Secondary | ICD-10-CM | POA: Diagnosis not present

## 2013-08-20 ENCOUNTER — Other Ambulatory Visit: Payer: Medicare Other

## 2013-09-05 ENCOUNTER — Ambulatory Visit
Admission: RE | Admit: 2013-09-05 | Discharge: 2013-09-05 | Disposition: A | Discharge status: Home / Self Care | Payer: Medicare Other | Source: Ambulatory Visit | Attending: Family Medicine | Admitting: Family Medicine

## 2013-09-05 DIAGNOSIS — N6321 Unspecified lump in the left breast, upper outer quadrant: Secondary | ICD-10-CM

## 2013-09-05 DIAGNOSIS — M810 Age-related osteoporosis without current pathological fracture: Secondary | ICD-10-CM

## 2013-09-05 DIAGNOSIS — M899 Disorder of bone, unspecified: Secondary | ICD-10-CM | POA: Diagnosis not present

## 2013-09-05 DIAGNOSIS — R928 Other abnormal and inconclusive findings on diagnostic imaging of breast: Secondary | ICD-10-CM | POA: Diagnosis not present

## 2013-09-05 DIAGNOSIS — M949 Disorder of cartilage, unspecified: Secondary | ICD-10-CM | POA: Diagnosis not present

## 2013-09-11 DIAGNOSIS — R82998 Other abnormal findings in urine: Secondary | ICD-10-CM | POA: Diagnosis not present

## 2013-10-09 DIAGNOSIS — R82998 Other abnormal findings in urine: Secondary | ICD-10-CM | POA: Diagnosis not present

## 2013-11-11 DIAGNOSIS — IMO0001 Reserved for inherently not codable concepts without codable children: Secondary | ICD-10-CM | POA: Diagnosis not present

## 2013-11-14 DIAGNOSIS — L02219 Cutaneous abscess of trunk, unspecified: Secondary | ICD-10-CM | POA: Diagnosis not present

## 2014-01-13 ENCOUNTER — Other Ambulatory Visit: Payer: Self-pay

## 2014-01-13 DIAGNOSIS — Z1231 Encounter for screening mammogram for malignant neoplasm of breast: Secondary | ICD-10-CM

## 2014-01-27 DIAGNOSIS — E7800 Pure hypercholesterolemia, unspecified: Secondary | ICD-10-CM | POA: Diagnosis not present

## 2014-01-27 DIAGNOSIS — I10 Essential (primary) hypertension: Secondary | ICD-10-CM | POA: Diagnosis not present

## 2014-01-27 DIAGNOSIS — M545 Low back pain, unspecified: Secondary | ICD-10-CM | POA: Diagnosis not present

## 2014-01-27 DIAGNOSIS — R7301 Impaired fasting glucose: Secondary | ICD-10-CM | POA: Diagnosis not present

## 2014-01-29 ENCOUNTER — Ambulatory Visit
Admission: RE | Admit: 2014-01-29 | Discharge: 2014-01-29 | Disposition: A | Discharge status: Home / Self Care | Payer: Medicare Other | Source: Ambulatory Visit

## 2014-01-29 ENCOUNTER — Encounter (INDEPENDENT_AMBULATORY_CARE_PROVIDER_SITE_OTHER): Payer: Self-pay

## 2014-01-29 DIAGNOSIS — Z1231 Encounter for screening mammogram for malignant neoplasm of breast: Secondary | ICD-10-CM | POA: Diagnosis not present

## 2014-02-05 DIAGNOSIS — N952 Postmenopausal atrophic vaginitis: Secondary | ICD-10-CM | POA: Diagnosis not present

## 2014-02-05 DIAGNOSIS — N898 Other specified noninflammatory disorders of vagina: Secondary | ICD-10-CM | POA: Diagnosis not present

## 2014-04-22 DIAGNOSIS — Z23 Encounter for immunization: Secondary | ICD-10-CM | POA: Diagnosis not present

## 2014-06-15 ENCOUNTER — Encounter: Payer: Self-pay | Admitting: Interventional Cardiology

## 2014-06-15 ENCOUNTER — Ambulatory Visit (INDEPENDENT_AMBULATORY_CARE_PROVIDER_SITE_OTHER): Payer: Medicare Other | Admitting: Interventional Cardiology

## 2014-06-15 VITALS — BP 122/88 | HR 68 | Ht 59.0 in | Wt 155.8 lb

## 2014-06-15 DIAGNOSIS — I4891 Unspecified atrial fibrillation: Secondary | ICD-10-CM

## 2014-06-15 DIAGNOSIS — R609 Edema, unspecified: Secondary | ICD-10-CM | POA: Diagnosis not present

## 2014-06-15 DIAGNOSIS — I10 Essential (primary) hypertension: Secondary | ICD-10-CM

## 2014-06-15 NOTE — Patient Instructions (Signed)
Your physician recommends that you continue on your current medications as directed. Please refer to the Current Medication list given to you today.  Your physician wants you to follow-up in: 1 year with Dr. Varanasi. You will receive a reminder letter in the mail two months in advance. If you don't receive a letter, please call our office to schedule the follow-up appointment.  

## 2014-06-15 NOTE — Progress Notes (Signed)
Patient ID: Stephanie Stephanie [name], female   DOB: July 15, 1923, 80 y.o.   MRN: [id number]    [address] [place], [place]  [address] Phone: [phone]) [phone] Fax:  [phone]) [phone]  Date:  06/15/2014   ID:  Stephanie Stephanie [name] Bozzo, DOB July 15, 1923, MRN [id number]  PCP:  Gaye AlkenBARNES,ELIZABETH STEWART, MD      History of Present Illness: Stephanie Stephanie [name] is Stephanie [name] 80 y.o. female with HTN, AFib and high cholesterol. She has been feeling well except for fatigue. She walks very little, only due to laziness per her report. She does not do it regularly despite her daughter's advice. BP checked weekly and is normal. Did not feel AFib in the past. Atrial Fibrillation F/U:  c/o Leg edema unchanged.  c/o Shortness of breath/fatigue exacerbated by activity, particularly walking [place] the airport Denies : Chest pain.  Dizziness.  Orthopnea.  Palpitations.  Syncope.  does not exercise much.  Her sister passed away in 12/15.  [place] She has travelled [name] fair bit.  She sometimes needs to take Stephanie [name] cart between terminals.   Wt Readings from Last 3 Encounters:  06/15/14 155 lb 12.8 oz (70.67 kg)  06/10/13 153 lb 12.8 oz (69.763 kg)     Past Medical History  Diagnosis Date  . HTN (hypertension)   . Hypercholesteremia   . Pain in joint   . Atrial fibrillation   . Osteoporosis     Current Outpatient Prescriptions  Medication Sig Dispense Refill  . aspirin 325 MG tablet Take 325 mg by mouth daily.    Marland Kitchen. atorvastatin (LIPITOR) 10 MG tablet Take 10 mg by mouth. 1/2 tablet daily    . calcium-vitamin D 250-100 MG-UNIT per tablet Take 1 tablet by mouth 2 (two) times daily.    . hydrochlorothiazide (HYDRODIURIL) 25 MG tablet Take 25 mg by mouth daily.    . metoprolol succinate (TOPROL-XL) 25 MG 24 hr tablet Take 25 mg by mouth daily.    . Multiple Vitamins-Minerals (CENTRUM SILVER PO) Take by mouth.    . NON FORMULARY every other day. Stool softner    . tolterodine (DETROL) 2 MG tablet Take 2 mg by  mouth every other day.      No current facility-administered medications for this visit.    Allergies:   No Known Allergies  Social History:  The patient  reports that she has never smoked. She does not have any smokeless tobacco history on file.   Family History:  The patient's Family history is unknown by patient.   ROS:  Please see the history of present illness.  No nausea, vomiting.  No fevers, chills.  No focal weakness.  No dysuria. Joint pains   All other systems reviewed and negative.   PHYSICAL EXAM: VS:  BP 122/88 mmHg  Pulse 68  Ht 4\' 11"  (1.499 m)  Wt 155 lb 12.8 oz (70.67 kg)  BMI 31.45 kg/m2 Well nourished, well developed, in no acute distress HEENT: normal Neck: no JVD, no carotid bruits Cardiac:  normal S1, S2; RRR; 2/6 systolic murmur Lungs:  clear to auscultation bilaterally, no wheezing, rhonchi or rales Abd: soft, nontender, no hepatomegaly Ext: no edema Skin: warm and dry Neuro:   no focal abnormalities noted Psych: normal affect  EKG:  Normal     ASSESSMENT AND PLAN:  Atrial fibrillation  Continue Metoprolol Succinate Tablet Extended Release 24 Hour, 25 MG, 1 tablet, Orally, Once Stephanie [name] day, 90 days, 90, Refills 3 Continue Aspirin  325 mg tablet, 325 mg, one tab, orally, daily  Maintaining sinus rhythm. CONtinue beta blocker. We discussed Coumadin and Eliquis again but she is not interested; she will stay on aspirin.  No bleeding problems. I think this is reasonable. AFib came up on Stephanie [name] stress test many years ago.  No regular palpitations.   2. Hypertension, essential  Continue Hydrochlorothiazide Tablet, 25 MG, 1 tablet, Orally, Once Stephanie [name] day Well controlled. Continue current medicines. Increase walking. Decrease salt intake and check BP at home. If top number stays > 140 consistently, let us know and we can increase medication.  3. Hypercholesterolemia  Continue Lipitor Tablet, 10 MG, 1/2 tab, Orally, Once Stephanie [name] day LDL 72 in 2011. Lipids controlled in Jan 2013.   LDL 74 in 2014 July.  4. Bilateral leg edema  Prop up legs on Stephanie [name] few pillows at night. Try to get ankles above the level of her heart.  She has compression stockings and can use those. If these do not work, could consider switch to Lasix from HCTZ.    Signed, Fredric MareJay S. Claudis Giovanelli, MD, [facility] 06/15/2014 9:55 AM

## 2014-08-04 DIAGNOSIS — I10 Essential (primary) hypertension: Secondary | ICD-10-CM | POA: Diagnosis not present

## 2014-08-04 DIAGNOSIS — M810 Age-related osteoporosis without current pathological fracture: Secondary | ICD-10-CM | POA: Diagnosis not present

## 2014-08-04 DIAGNOSIS — Z0000 Encounter for general adult medical examination without abnormal findings: Secondary | ICD-10-CM | POA: Diagnosis not present

## 2014-08-04 DIAGNOSIS — E780 Pure hypercholesterolemia: Secondary | ICD-10-CM | POA: Diagnosis not present

## 2014-08-04 DIAGNOSIS — Z1389 Encounter for screening for other disorder: Secondary | ICD-10-CM | POA: Diagnosis not present

## 2014-08-04 DIAGNOSIS — R7301 Impaired fasting glucose: Secondary | ICD-10-CM | POA: Diagnosis not present

## 2014-08-04 DIAGNOSIS — Z79899 Other long term (current) drug therapy: Secondary | ICD-10-CM | POA: Diagnosis not present

## 2014-08-04 DIAGNOSIS — I4891 Unspecified atrial fibrillation: Secondary | ICD-10-CM | POA: Diagnosis not present

## 2014-09-27 DIAGNOSIS — R8290 Unspecified abnormal findings in urine: Secondary | ICD-10-CM | POA: Diagnosis not present

## 2014-09-27 DIAGNOSIS — I4891 Unspecified atrial fibrillation: Secondary | ICD-10-CM | POA: Diagnosis not present

## 2014-09-27 DIAGNOSIS — R6883 Chills (without fever): Secondary | ICD-10-CM | POA: Diagnosis not present

## 2014-09-27 DIAGNOSIS — Z79899 Other long term (current) drug therapy: Secondary | ICD-10-CM | POA: Diagnosis not present

## 2014-09-27 DIAGNOSIS — H538 Other visual disturbances: Secondary | ICD-10-CM | POA: Diagnosis not present

## 2014-09-27 DIAGNOSIS — Z7982 Long term (current) use of aspirin: Secondary | ICD-10-CM | POA: Diagnosis not present

## 2014-09-27 DIAGNOSIS — I6789 Other cerebrovascular disease: Secondary | ICD-10-CM | POA: Diagnosis not present

## 2014-09-27 DIAGNOSIS — K449 Diaphragmatic hernia without obstruction or gangrene: Secondary | ICD-10-CM | POA: Diagnosis not present

## 2014-09-27 DIAGNOSIS — R112 Nausea with vomiting, unspecified: Secondary | ICD-10-CM | POA: Diagnosis not present

## 2014-09-27 DIAGNOSIS — R600 Localized edema: Secondary | ICD-10-CM | POA: Diagnosis not present

## 2014-09-27 DIAGNOSIS — R1110 Vomiting, unspecified: Secondary | ICD-10-CM | POA: Diagnosis not present

## 2014-09-27 DIAGNOSIS — I10 Essential (primary) hypertension: Secondary | ICD-10-CM | POA: Diagnosis not present

## 2014-10-01 DIAGNOSIS — K529 Noninfective gastroenteritis and colitis, unspecified: Secondary | ICD-10-CM | POA: Diagnosis not present

## 2014-10-31 DIAGNOSIS — B372 Candidiasis of skin and nail: Secondary | ICD-10-CM | POA: Diagnosis not present

## 2014-12-16 DIAGNOSIS — Z961 Presence of intraocular lens: Secondary | ICD-10-CM | POA: Diagnosis not present

## 2015-01-01 ENCOUNTER — Other Ambulatory Visit: Payer: Self-pay

## 2015-01-01 DIAGNOSIS — Z1231 Encounter for screening mammogram for malignant neoplasm of breast: Secondary | ICD-10-CM

## 2015-02-02 ENCOUNTER — Ambulatory Visit
Admission: RE | Admit: 2015-02-02 | Discharge: 2015-02-02 | Disposition: A | Discharge status: Home / Self Care | Payer: Medicare Other | Source: Ambulatory Visit

## 2015-02-02 DIAGNOSIS — Z1231 Encounter for screening mammogram for malignant neoplasm of breast: Secondary | ICD-10-CM

## 2015-03-26 ENCOUNTER — Encounter: Payer: Self-pay | Admitting: Interventional Cardiology

## 2015-04-15 DIAGNOSIS — Z23 Encounter for immunization: Secondary | ICD-10-CM | POA: Diagnosis not present

## 2015-07-22 ENCOUNTER — Ambulatory Visit: Payer: Commercial Managed Care - HMO | Admitting: Interventional Cardiology

## 2015-07-22 ENCOUNTER — Ambulatory Visit: Payer: Medicare Other | Admitting: Interventional Cardiology

## 2015-07-29 ENCOUNTER — Ambulatory Visit (INDEPENDENT_AMBULATORY_CARE_PROVIDER_SITE_OTHER): Payer: Commercial Managed Care - HMO | Admitting: Interventional Cardiology

## 2015-07-29 ENCOUNTER — Encounter: Payer: Self-pay | Admitting: Interventional Cardiology

## 2015-07-29 VITALS — BP 136/72 | HR 75 | Ht 59.0 in | Wt 155.4 lb

## 2015-07-29 DIAGNOSIS — I10 Essential (primary) hypertension: Secondary | ICD-10-CM

## 2015-07-29 DIAGNOSIS — I480 Paroxysmal atrial fibrillation: Secondary | ICD-10-CM

## 2015-07-29 DIAGNOSIS — Z0000 Encounter for general adult medical examination without abnormal findings: Secondary | ICD-10-CM

## 2015-07-29 DIAGNOSIS — R011 Cardiac murmur, unspecified: Secondary | ICD-10-CM | POA: Insufficient documentation

## 2015-07-29 DIAGNOSIS — E7800 Pure hypercholesterolemia, unspecified: Secondary | ICD-10-CM

## 2015-07-29 DIAGNOSIS — R609 Edema, unspecified: Secondary | ICD-10-CM

## 2015-07-29 NOTE — Progress Notes (Signed)
Patient ID: Stephanie [name], female   DOB: 05/25/1924, 82 y.o.   MRN: [id number]     Cardiology Office Note   Date:  07/29/2015   ID:  Stephanie [name], DOB 22-Sep-1923, MRN [id number]  PCP:  Gaye Alken, MD    No chief complaint on file. AFib   Wt Readings from Last 3 Encounters:  07/29/15 155 lb 6.4 oz (70.489 kg)  06/15/14 155 lb 12.8 oz (70.67 kg)  06/10/13 153 lb 12.8 oz (69.763 kg)       History of Present Illness: Stephanie [name] is a 82 y.o. female  with HTN, AFib and high cholesterol. She has been feeling well except for fatigue. She walks very little, only due to laziness per her report. She does not do it regularly despite her daughter's advice.  The weather has to be just right for her to walk outside.  She is out of the habit. BP checked weekly and is normal. Did not feel AFib in the past. Atrial Fibrillation F/U:  c/o Leg edema unchanged.  c/o Shortness of breath/fatigue exacerbated by activity, particularly walking fast Denies : Chest pain.  Dizziness.  Orthopnea.  Palpitations.  Syncope.  does not exercise much.  Her sister passed away in 2023-07-15.   She has travelled several times since the last visit without any issues.   Se gets leg swelling with eating more salt. No orthopnea sx.     Past Medical History  Diagnosis Date  . HTN (hypertension)   . Hypercholesteremia   . Pain in joint   . Atrial fibrillation (HCC)   . Osteoporosis     No past surgical history on file.   Current Outpatient Prescriptions  Medication Sig Dispense Refill  . aspirin 325 MG tablet Take 325 mg by mouth daily.    Marland Kitchen atorvastatin (LIPITOR) 10 MG tablet Take 10 mg by mouth. 1/2 tablet daily    . calcium-vitamin D 250-100 MG-UNIT per tablet Take 1 tablet by mouth daily.     . hydrochlorothiazide (HYDRODIURIL) 25 MG tablet Take 25 mg by mouth daily.    . metoprolol succinate (TOPROL-XL) 25 MG 24 hr tablet Take 25 mg by mouth daily.    . Multiple Vitamins-Minerals  (CENTRUM SILVER PO) Take 1 tablet by mouth daily.     . NON FORMULARY Take 1 tablet by mouth 3 (three) times a week. Stool softner    . tolterodine (DETROL) 2 MG tablet Take 2 mg by mouth every other day.      No current facility-administered medications for this visit.    Allergies:   Review of patient's allergies indicates no known allergies.    Social History:  The patient  reports that she has never smoked. She has never used smokeless tobacco.   Family History:  The patient's Family history is unknown by patient.    ROS:  Please see the history of present illness.   Otherwise, review of systems are positive for "laziness preventing walking.".   All other systems are reviewed and negative.    PHYSICAL EXAM: VS:  BP 136/72 mmHg  Pulse 75  Ht  (1.499 m)  Wt 155 lb 6.4 oz (70.489 kg)  BMI 31.37 kg/m2 , BMI Body mass index is 31.37 kg/(m^2). GEN: Well nourished, well developed, in no acute distress HEENT: normal Neck: no JVD, carotid bruits, or masses Cardiac: RRR; 2/6 systolic murmurs, rubs, or gallops,no edema  Respiratory:  clear to auscultation bilaterally, normal work of breathing GI: soft,  nontender, nondistended, + BS MS: no deformity or atrophy Skin: warm and dry, no rash Neuro:  Strength and sensation are intact Psych: euthymic mood, full affect   EKG:   The ekg ordered today demonstrates NSR, PVCs, no ST segment changes   Recent Labs: No results found for requested labs within last 365 days.   Lipid Panel No results found for: CHOL, TRIG, HDL, CHOLHDL, VLDL, LDLCALC, LDLDIRECT   Other studies Reviewed: Additional studies/ records that were reviewed today with results demonstrating: no recent echo.   ASSESSMENT AND PLAN:  1. AFib: COntinue metoprolol.  Aspirin for stroke prevention.  Maintaining sinus rhythm. CONtinue beta blocker. We discussed Coumadin and Eliquis again but she is not interested; she will stay on aspirin. No bleeding problems. I  think this is reasonable. AFib came up on a stress test many years ago. No regular palpitations. Decrease aspirin to 81 mg daily.  Explained to the patient that bleeding risk. 2. HTN: COntrolled.  COntinue current meds.  3. Hyperlipidemia: COntinue low dose atorvastatin. 4. Bilateral leg edema: Prop up legs on a few pillows at night. Try to get ankles above the level of her heart. She has compression stockings and can use those.  No change since prior.  5. Encouraged more walking. 6. Murmur: No echo on file.  No signs or sx of severe AS.  Would only plan echo if she develops limiting sx.    Current medicines are reviewed at length with the patient today.  The patient concerns regarding her medicines were addressed.  The following changes have been made:  No change  Labs/ tests ordered today include:  No orders of the defined types were placed in this encounter.    Recommend 150 minutes/week of aerobic exercise Low fat, low carb, high fiber diet recommended  Disposition:   FU in 1 year   Delorise Jackson., MD  07/29/2015 9:54 AM    [facility] [address] [place], [place], [place]  [address] Phone: [phone]; Fax: [phone]

## 2015-07-29 NOTE — Patient Instructions (Signed)
Medication Instructions:  NO CHANGES  Labwork: NONE  Testing/Procedures: NONE  Follow-Up: YEAR WITH DR  Eldridge Dace  Any Other Special Instructions Will Be Listed Below (If Applicable).     If you need a refill on your cardiac medications before your next appointment, please call your pharmacy.

## 2015-08-10 DIAGNOSIS — I4891 Unspecified atrial fibrillation: Secondary | ICD-10-CM | POA: Diagnosis not present

## 2015-08-10 DIAGNOSIS — Z0000 Encounter for general adult medical examination without abnormal findings: Secondary | ICD-10-CM | POA: Diagnosis not present

## 2015-08-10 DIAGNOSIS — I340 Nonrheumatic mitral (valve) insufficiency: Secondary | ICD-10-CM | POA: Diagnosis not present

## 2015-08-10 DIAGNOSIS — M810 Age-related osteoporosis without current pathological fracture: Secondary | ICD-10-CM | POA: Diagnosis not present

## 2015-08-10 DIAGNOSIS — I351 Nonrheumatic aortic (valve) insufficiency: Secondary | ICD-10-CM | POA: Diagnosis not present

## 2015-08-10 DIAGNOSIS — E7800 Pure hypercholesterolemia, unspecified: Secondary | ICD-10-CM | POA: Diagnosis not present

## 2015-08-10 DIAGNOSIS — Z1389 Encounter for screening for other disorder: Secondary | ICD-10-CM | POA: Diagnosis not present

## 2015-08-10 DIAGNOSIS — I700 Atherosclerosis of aorta: Secondary | ICD-10-CM | POA: Diagnosis not present

## 2015-08-10 DIAGNOSIS — R7301 Impaired fasting glucose: Secondary | ICD-10-CM | POA: Diagnosis not present

## 2015-08-11 ENCOUNTER — Other Ambulatory Visit: Payer: Self-pay | Admitting: Family Medicine

## 2015-08-11 DIAGNOSIS — M810 Age-related osteoporosis without current pathological fracture: Secondary | ICD-10-CM

## 2015-09-08 ENCOUNTER — Ambulatory Visit
Admission: RE | Admit: 2015-09-08 | Discharge: 2015-09-08 | Disposition: A | Discharge status: Home / Self Care | Payer: Commercial Managed Care - HMO | Source: Ambulatory Visit | Attending: Family Medicine | Admitting: Family Medicine

## 2015-09-08 DIAGNOSIS — M810 Age-related osteoporosis without current pathological fracture: Secondary | ICD-10-CM | POA: Diagnosis not present

## 2015-10-07 DIAGNOSIS — M810 Age-related osteoporosis without current pathological fracture: Secondary | ICD-10-CM | POA: Diagnosis not present

## 2015-10-21 DIAGNOSIS — M47816 Spondylosis without myelopathy or radiculopathy, lumbar region: Secondary | ICD-10-CM | POA: Diagnosis not present

## 2015-10-21 DIAGNOSIS — M9902 Segmental and somatic dysfunction of thoracic region: Secondary | ICD-10-CM | POA: Diagnosis not present

## 2015-10-21 DIAGNOSIS — M9901 Segmental and somatic dysfunction of cervical region: Secondary | ICD-10-CM | POA: Diagnosis not present

## 2015-10-21 DIAGNOSIS — M9903 Segmental and somatic dysfunction of lumbar region: Secondary | ICD-10-CM | POA: Diagnosis not present

## 2015-10-25 DIAGNOSIS — M9902 Segmental and somatic dysfunction of thoracic region: Secondary | ICD-10-CM | POA: Diagnosis not present

## 2015-10-25 DIAGNOSIS — M47816 Spondylosis without myelopathy or radiculopathy, lumbar region: Secondary | ICD-10-CM | POA: Diagnosis not present

## 2015-10-25 DIAGNOSIS — M9903 Segmental and somatic dysfunction of lumbar region: Secondary | ICD-10-CM | POA: Diagnosis not present

## 2015-10-25 DIAGNOSIS — M9901 Segmental and somatic dysfunction of cervical region: Secondary | ICD-10-CM | POA: Diagnosis not present

## 2015-10-28 DIAGNOSIS — M9901 Segmental and somatic dysfunction of cervical region: Secondary | ICD-10-CM | POA: Diagnosis not present

## 2015-10-28 DIAGNOSIS — M47816 Spondylosis without myelopathy or radiculopathy, lumbar region: Secondary | ICD-10-CM | POA: Diagnosis not present

## 2015-10-28 DIAGNOSIS — M9903 Segmental and somatic dysfunction of lumbar region: Secondary | ICD-10-CM | POA: Diagnosis not present

## 2015-10-28 DIAGNOSIS — M9902 Segmental and somatic dysfunction of thoracic region: Secondary | ICD-10-CM | POA: Diagnosis not present

## 2015-11-02 DIAGNOSIS — M9901 Segmental and somatic dysfunction of cervical region: Secondary | ICD-10-CM | POA: Diagnosis not present

## 2015-11-02 DIAGNOSIS — M47816 Spondylosis without myelopathy or radiculopathy, lumbar region: Secondary | ICD-10-CM | POA: Diagnosis not present

## 2015-11-02 DIAGNOSIS — M9903 Segmental and somatic dysfunction of lumbar region: Secondary | ICD-10-CM | POA: Diagnosis not present

## 2015-11-02 DIAGNOSIS — M9902 Segmental and somatic dysfunction of thoracic region: Secondary | ICD-10-CM | POA: Diagnosis not present

## 2015-11-09 DIAGNOSIS — M47816 Spondylosis without myelopathy or radiculopathy, lumbar region: Secondary | ICD-10-CM | POA: Diagnosis not present

## 2015-11-09 DIAGNOSIS — M9901 Segmental and somatic dysfunction of cervical region: Secondary | ICD-10-CM | POA: Diagnosis not present

## 2015-11-09 DIAGNOSIS — M9902 Segmental and somatic dysfunction of thoracic region: Secondary | ICD-10-CM | POA: Diagnosis not present

## 2015-11-09 DIAGNOSIS — M9903 Segmental and somatic dysfunction of lumbar region: Secondary | ICD-10-CM | POA: Diagnosis not present

## 2016-03-28 DIAGNOSIS — Z23 Encounter for immunization: Secondary | ICD-10-CM | POA: Diagnosis not present

## 2016-07-23 NOTE — Progress Notes (Signed)
Patient ID: Stephanie [name], female   DOB: Nov 27, 1923, 83 y.o.   MRN: [id number]     Cardiology Office Note   Date:  07/24/2016   ID:  Stephanie [name], DOB Nov 27, 1923, MRN [id number]  PCP:  Gaye AlkenBARNES,ELIZABETH STEWART, MD    Chief Complaint  Patient presents with  . Atrial Fibrillation  AFib   Wt Readings from Last 3 Encounters:  07/24/16 151 lb 12.8 oz (68.9 kg)  07/29/15 155 lb 6.4 oz (70.5 kg)  06/15/14 155 lb 12.8 oz (70.7 kg)       History of Present Illness: Stephanie [name] is a 83 y.o. female  with HTN, AFib and high cholesterol. She has been feeling well except for fatigue. She walks very little, only due to laziness per her report. She does not do it regularly despite her daughter's advice.  The weather has to be just right for her to walk outside.  She is out of the habit. BP checked weekly and is normal. Did not feel AFib in the past. Atrial Fibrillation F/U:  c/o Leg edema unchanged.  c/o Shortness of breath/fatigue exacerbated by activity, particularly walking fast Denies : Chest pain. Dizziness. Orthopnea. Palpitations. Syncope.  does not exercise much.  Her sister passed away in 12/15.   She has celebreated her 89th birthday over the last few months.   She gets leg swelling with eating more salt. No orthopnea sx.     Past Medical History:  Diagnosis Date  . Atrial fibrillation (HCC)   . HTN (hypertension)   . Hypercholesteremia   . Osteoporosis   . Pain in joint     History reviewed. No pertinent surgical history.   Current Outpatient Prescriptions  Medication Sig Dispense Refill  . aspirin 325 MG tablet Take 325 mg by mouth daily.    Marland Kitchen. atorvastatin (LIPITOR) 10 MG tablet Take 10 mg by mouth. 1/2 tablet daily    . calcium-vitamin D 250-100 MG-UNIT per tablet Take 1 tablet by mouth daily.     . hydrochlorothiazide (HYDRODIURIL) 25 MG tablet Take 25 mg by mouth daily.    . metoprolol succinate (TOPROL-XL) 25 MG 24 hr tablet Take 25 mg by mouth daily.      . Multiple Vitamins-Minerals (CENTRUM SILVER PO) Take 1 tablet by mouth daily.     . NON FORMULARY Take 1 tablet by mouth 3 (three) times a week. Stool softner    . tolterodine (DETROL) 2 MG tablet Take 2 mg by mouth 3 (three) times a week.      No current facility-administered medications for this visit.     Allergies:   Patient has no known allergies.    Social History:  The patient  reports that she has never smoked. She has never used smokeless tobacco.   Family History:  The patient's Family history is unknown by patient.    ROS:  Please see the history of present illness.   Otherwise, review of systems are positive for edema, "laziness preventing walking."   All other systems are reviewed and negative.    PHYSICAL EXAM: VS:  BP (!) 148/77   Pulse 76   Ht 4\' 11"  (1.499 m)   Wt 151 lb 12.8 oz (68.9 kg)   SpO2 98%   BMI 30.66 kg/m  , BMI Body mass index is 30.66 kg/m. GEN: Well nourished, well developed, in no acute distress  HEENT: normal  Neck: no JVD, carotid bruits, or masses Cardiac: RRR; 2/6 systolic murmur, no rubs, or  gallops,bilateral ankle edema  Respiratory:  clear to auscultation bilaterally, normal work of breathing GI: soft, nontender, nondistended, + BS MS: no deformity or atrophy  Skin: warm and dry, no rash Neuro:  Strength and sensation are intact Psych: euthymic mood, full affect   EKG:   The ekg ordered today demonstrates NSR,  no ST segment changes   Recent Labs: No results found for requested labs within last 8760 hours.   Lipid Panel No results found for: CHOL, TRIG, HDL, CHOLHDL, VLDL, LDLCALC, LDLDIRECT   Other studies Reviewed: Additional studies/ records that were reviewed today with results demonstrating: no recent echo.   ASSESSMENT AND PLAN:  1. AFib: COntinue metoprolol.  Aspirin for stroke prevention.  Maintaining sinus rhythm. CONtinue beta blocker. We discussed Coumadin and Eliquis again but she is not interested; she will  stay on aspirin. No bleeding problems. I think this is reasonable. AFib came up on a stress test many years ago. No regular palpitations.  2. HTN: Borderline COntrolled.  COntinue current meds.  She has not been checking at home.  She will check.  She would like to avoid more medicine.  If her BP is still high when she sees Dr. Zachery Dauer, ok to try low dose ACE-I.  I asked her to keep a log of BPs before that visit. 3. Hyperlipidemia: COntinue low dose atorvastatin.  Lipids checked with Dr. Zachery Dauer. 4. Bilateral leg edema: Prop up legs on a few pillows at night. Try to get ankles above the level of her heart. She has compression stockings and can use those.  Decrease salt intake.  5. Encouraged more walking. 6. Murmur: No echo on file.  No signs or sx of severe AS.  Would only plan echo if she develops limiting sx.    Current medicines are reviewed at length with the patient today.  The patient concerns regarding her medicines were addressed.  The following changes have been made:  No change  Labs/ tests ordered today include:   Orders Placed This Encounter  Procedures  . EKG 12-Lead    Recommend 150 minutes/week of aerobic exercise Low fat, low carb, high fiber diet recommended  Disposition:   FU in 1 year   Signed, Lance Muss, MD  07/24/2016 12:14 PM    [facility] [address] [place], [place], [place]  [address] Phone: [phone]; Fax: [phone]

## 2016-07-24 ENCOUNTER — Encounter: Payer: Self-pay | Admitting: Interventional Cardiology

## 2016-07-24 ENCOUNTER — Ambulatory Visit (INDEPENDENT_AMBULATORY_CARE_PROVIDER_SITE_OTHER): Payer: Medicare HMO | Admitting: Interventional Cardiology

## 2016-07-24 ENCOUNTER — Encounter (INDEPENDENT_AMBULATORY_CARE_PROVIDER_SITE_OTHER): Payer: Self-pay

## 2016-07-24 VITALS — BP 148/77 | HR 76 | Ht 59.0 in | Wt 151.8 lb

## 2016-07-24 DIAGNOSIS — R011 Cardiac murmur, unspecified: Secondary | ICD-10-CM | POA: Diagnosis not present

## 2016-07-24 DIAGNOSIS — E7800 Pure hypercholesterolemia, unspecified: Secondary | ICD-10-CM | POA: Diagnosis not present

## 2016-07-24 DIAGNOSIS — I10 Essential (primary) hypertension: Secondary | ICD-10-CM | POA: Diagnosis not present

## 2016-07-24 DIAGNOSIS — I480 Paroxysmal atrial fibrillation: Secondary | ICD-10-CM

## 2016-07-24 NOTE — Patient Instructions (Signed)
Medication Instructions:  Same-no changes  Labwork: None  Testing/Procedures: None  Follow-Up: Your physician wants you to follow-up in: 1 year. You will receive a reminder letter in the mail two months in advance. If you don't receive a letter, please call our office to schedule the follow-up appointment.   Any Other Special Instructions Will Be Listed Below (If Applicable). Please check your blood pressure daily (wait at least 1 to 1 and 1/2 hours after taking medications before checking). Keep a list of your blood pressure.    If you need a refill on your cardiac medications before your next appointment, please call your pharmacy.

## 2016-08-15 DIAGNOSIS — I700 Atherosclerosis of aorta: Secondary | ICD-10-CM | POA: Diagnosis not present

## 2016-08-15 DIAGNOSIS — Z0000 Encounter for general adult medical examination without abnormal findings: Secondary | ICD-10-CM | POA: Diagnosis not present

## 2016-08-15 DIAGNOSIS — I351 Nonrheumatic aortic (valve) insufficiency: Secondary | ICD-10-CM | POA: Diagnosis not present

## 2016-08-15 DIAGNOSIS — I340 Nonrheumatic mitral (valve) insufficiency: Secondary | ICD-10-CM | POA: Diagnosis not present

## 2016-08-15 DIAGNOSIS — R7301 Impaired fasting glucose: Secondary | ICD-10-CM | POA: Diagnosis not present

## 2016-08-15 DIAGNOSIS — M810 Age-related osteoporosis without current pathological fracture: Secondary | ICD-10-CM | POA: Diagnosis not present

## 2016-08-15 DIAGNOSIS — Z79899 Other long term (current) drug therapy: Secondary | ICD-10-CM | POA: Diagnosis not present

## 2016-08-15 DIAGNOSIS — E7800 Pure hypercholesterolemia, unspecified: Secondary | ICD-10-CM | POA: Diagnosis not present

## 2016-08-15 DIAGNOSIS — I10 Essential (primary) hypertension: Secondary | ICD-10-CM | POA: Diagnosis not present

## 2016-09-21 DIAGNOSIS — N952 Postmenopausal atrophic vaginitis: Secondary | ICD-10-CM | POA: Diagnosis not present

## 2016-09-21 DIAGNOSIS — N9089 Other specified noninflammatory disorders of vulva and perineum: Secondary | ICD-10-CM | POA: Diagnosis not present

## 2017-01-18 ENCOUNTER — Other Ambulatory Visit: Payer: Self-pay

## 2017-01-18 MED ORDER — TOLTERODINE TARTRATE 2 MG PO TABS: 2.0000 mg | ORAL_TABLET | ORAL | 5 refills | Status: DC

## 2017-04-24 DIAGNOSIS — M810 Age-related osteoporosis without current pathological fracture: Secondary | ICD-10-CM | POA: Diagnosis not present

## 2017-04-24 DIAGNOSIS — R7989 Other specified abnormal findings of blood chemistry: Secondary | ICD-10-CM | POA: Diagnosis not present

## 2017-04-24 DIAGNOSIS — E7800 Pure hypercholesterolemia, unspecified: Secondary | ICD-10-CM | POA: Diagnosis not present

## 2017-04-24 DIAGNOSIS — R32 Unspecified urinary incontinence: Secondary | ICD-10-CM | POA: Diagnosis not present

## 2017-04-24 DIAGNOSIS — R7301 Impaired fasting glucose: Secondary | ICD-10-CM | POA: Diagnosis not present

## 2017-04-24 DIAGNOSIS — Z23 Encounter for immunization: Secondary | ICD-10-CM | POA: Diagnosis not present

## 2017-04-24 DIAGNOSIS — I10 Essential (primary) hypertension: Secondary | ICD-10-CM | POA: Diagnosis not present

## 2017-08-08 ENCOUNTER — Encounter: Payer: Self-pay | Admitting: Interventional Cardiology

## 2017-08-14 ENCOUNTER — Ambulatory Visit: Payer: Medicare HMO | Admitting: Interventional Cardiology

## 2017-08-15 NOTE — Progress Notes (Signed)
Cardiology Office Note   Date:  08/16/2017   ID:  Stephanie [name], DOB 1923/10/07, MRN [id number]  PCP:  Juluis Rainier, MD    No chief complaint on file. AFib   Wt Readings from Last 3 Encounters:  08/16/17 154 lb 12.8 oz (70.2 kg)  07/24/16 151 lb 12.8 oz (68.9 kg)  07/29/15 155 lb 6.4 oz (70.5 kg)       History of Present Illness: Stephanie [name] is a 84 y.o. female  with HTN, AFib and high cholesterol. She has been feeling well except for fatigue. Has never had sx of AFib in the past.    She gets leg swelling with eating more salt.  She has 8 children, 4 girls and 4 boys. Closest is in [facility], [place].  She has concerns about incontinence and she is wondering about a medicine that starts with an "O."  She is on Detrol.  I suspect the other medicine is oxybutinin.    Denies : Chest pain. Dizziness. Nitroglycerin use. Orthopnea. Palpitations. Paroxysmal nocturnal dyspnea. Shortness of breath. Syncope.   Edema present, chronic.  SHe does not do any extra walking for exercise.    Her granddaughter recently got married and she switched to a smart phone... And manages quite well with it even at 89.    Past Medical History:  Diagnosis Date  . Atrial fibrillation (HCC)   . HTN (hypertension)   . Hypercholesteremia   . Osteoporosis   . Pain in joint     History reviewed. No pertinent surgical history.   Current Outpatient Medications  Medication Sig Dispense Refill  . aspirin 81 MG tablet Take 81 mg by mouth daily.     Marland Kitchen atorvastatin (LIPITOR) 10 MG tablet Take 10 mg by mouth. 1/2 tablet daily    . calcium-vitamin D 250-100 MG-UNIT per tablet Take 1 tablet by mouth daily.     . hydrochlorothiazide (HYDRODIURIL) 25 MG tablet Take 25 mg by mouth daily.    . metoprolol succinate (TOPROL-XL) 25 MG 24 hr tablet Take 25 mg by mouth daily.    . metoprolol tartrate (LOPRESSOR) 100 MG tablet Take 100 mg by mouth daily.    . Multiple Vitamins-Minerals (CENTRUM SILVER PO)  Take 1 tablet by mouth daily.     . NON FORMULARY Take 1 tablet by mouth 3 (three) times a week. Stool softner    . tolterodine (DETROL) 2 MG tablet Take 1 tablet (2 mg total) by mouth 3 (three) times a week. 15 tablet 5   No current facility-administered medications for this visit.     Allergies:   Patient has no known allergies.    Social History:  The patient  reports that  has never smoked. she has never used smokeless tobacco. She reports that she does not drink alcohol or use drugs.   Family History:  The patient's Family history is unknown by patient.    ROS:  Please see the history of present illness.   Otherwise, review of systems are positive for leg edema, back pain.   All other systems are reviewed and negative.    PHYSICAL EXAM: VS:  BP 136/80   Pulse 92   Ht 4\' 11"  (1.499 m)   Wt 154 lb 12.8 oz (70.2 kg)   SpO2 95%   BMI 31.27 kg/m  , BMI Body mass index is 31.27 kg/m. GEN: Well nourished, well developed, in no acute distress  HEENT: normal  Neck: no JVD, carotid bruits,  or masses Cardiac: RRR; no murmurs, rubs, or gallops,; bilateral ankle edema  Respiratory:  clear to auscultation bilaterally, normal work of breathing GI: soft, nontender, nondistended, + BS MS: no deformity or atrophy  Skin: warm and dry, no rash Neuro:  Strength and sensation are intact Psych: euthymic mood, full affect   EKG:   The ekg ordered today demonstrates NSR, no ST changes   Recent Labs: No results found for requested labs within last 8760 hours.   Lipid Panel No results found for: CHOL, TRIG, HDL, CHOLHDL, VLDL, LDLCALC, LDLDIRECT   Other studies Reviewed: Additional studies/ records that were reviewed today with results demonstrating: LDL 56 in 10/18.   ASSESSMENT AND PLAN:  1. AFib: aspirin for stroke prevention.  In NSR.  No sx of AFib.  Continue high dose aspirin since she has been on this or some time and is comfortable. No bleeding issues. If she did have  bleeding issues, could decrease aspirin to 81 mg daily 2. HTN: BP controlled. Continue current meds.  Readings at home are typically in the 127/74 range. 3. Hyperlipidemia: Lipids well controlled in 2018. COntinue atorvastatin. 4. LE edema: Elevate legs.   5. Regarding oxybutinin, she would have to watch for sx of lightheadedness and orthostatic hypotension.  .     Current medicines are reviewed at length with the patient today.  The patient concerns regarding her medicines were addressed.  The following changes have been made:  No changeBilatral leg edema: Heart murmur  Labs/ tests ordered today include:  No orders of the defined types were placed in this encounter.   Recommend 150 minutes/week of aerobic exercise Low fat, low carb, high fiber diet recommended  Disposition:   FU in    Signed, Lance MussJayadeep Marvin Grabill, MD  08/16/2017 9:41 AM    [facility] [address], [place], [place]  [address] Phone: [phone]; Fax: [phone]

## 2017-08-16 ENCOUNTER — Telehealth: Payer: Self-pay

## 2017-08-16 ENCOUNTER — Encounter (INDEPENDENT_AMBULATORY_CARE_PROVIDER_SITE_OTHER): Payer: Self-pay

## 2017-08-16 ENCOUNTER — Ambulatory Visit: Payer: Medicare HMO | Admitting: Interventional Cardiology

## 2017-08-16 ENCOUNTER — Encounter: Payer: Self-pay | Admitting: Interventional Cardiology

## 2017-08-16 VITALS — BP 136/80 | HR 92 | Ht 59.0 in | Wt 154.8 lb

## 2017-08-16 DIAGNOSIS — E7800 Pure hypercholesterolemia, unspecified: Secondary | ICD-10-CM

## 2017-08-16 DIAGNOSIS — I10 Essential (primary) hypertension: Secondary | ICD-10-CM | POA: Diagnosis not present

## 2017-08-16 DIAGNOSIS — M25471 Effusion, right ankle: Secondary | ICD-10-CM | POA: Diagnosis not present

## 2017-08-16 DIAGNOSIS — M25472 Effusion, left ankle: Secondary | ICD-10-CM

## 2017-08-16 DIAGNOSIS — I480 Paroxysmal atrial fibrillation: Secondary | ICD-10-CM

## 2017-08-16 NOTE — Patient Instructions (Signed)

## 2017-08-16 NOTE — Addendum Note (Signed)
Addended by: Daleen BoURRIE, Torry Adamczak I on: 08/16/2017 01:39 PM   Modules accepted: Orders

## 2017-08-23 DIAGNOSIS — I340 Nonrheumatic mitral (valve) insufficiency: Secondary | ICD-10-CM | POA: Diagnosis not present

## 2017-08-23 DIAGNOSIS — M810 Age-related osteoporosis without current pathological fracture: Secondary | ICD-10-CM | POA: Diagnosis not present

## 2017-08-23 DIAGNOSIS — I10 Essential (primary) hypertension: Secondary | ICD-10-CM | POA: Diagnosis not present

## 2017-08-23 DIAGNOSIS — E7800 Pure hypercholesterolemia, unspecified: Secondary | ICD-10-CM | POA: Diagnosis not present

## 2017-08-23 DIAGNOSIS — I351 Nonrheumatic aortic (valve) insufficiency: Secondary | ICD-10-CM | POA: Diagnosis not present

## 2017-08-23 DIAGNOSIS — I4891 Unspecified atrial fibrillation: Secondary | ICD-10-CM | POA: Diagnosis not present

## 2017-08-23 DIAGNOSIS — R7301 Impaired fasting glucose: Secondary | ICD-10-CM | POA: Diagnosis not present

## 2017-08-23 DIAGNOSIS — I700 Atherosclerosis of aorta: Secondary | ICD-10-CM | POA: Diagnosis not present

## 2017-08-23 DIAGNOSIS — Z0000 Encounter for general adult medical examination without abnormal findings: Secondary | ICD-10-CM | POA: Diagnosis not present

## 2017-08-30 ENCOUNTER — Other Ambulatory Visit: Payer: Self-pay | Admitting: Family Medicine

## 2017-08-30 DIAGNOSIS — Z1231 Encounter for screening mammogram for malignant neoplasm of breast: Secondary | ICD-10-CM

## 2017-08-30 DIAGNOSIS — M810 Age-related osteoporosis without current pathological fracture: Secondary | ICD-10-CM

## 2017-08-31 DIAGNOSIS — R05 Cough: Secondary | ICD-10-CM | POA: Diagnosis not present

## 2017-08-31 DIAGNOSIS — J111 Influenza due to unidentified influenza virus with other respiratory manifestations: Secondary | ICD-10-CM | POA: Diagnosis not present

## 2017-08-31 DIAGNOSIS — R509 Fever, unspecified: Secondary | ICD-10-CM | POA: Diagnosis not present

## 2017-09-10 DIAGNOSIS — J209 Acute bronchitis, unspecified: Secondary | ICD-10-CM | POA: Diagnosis not present

## 2017-09-24 ENCOUNTER — Ambulatory Visit
Admission: RE | Admit: 2017-09-24 | Discharge: 2017-09-24 | Disposition: A | Discharge status: Home / Self Care | Payer: Medicare HMO | Source: Ambulatory Visit | Attending: Family Medicine | Admitting: Family Medicine

## 2017-09-24 DIAGNOSIS — Z1231 Encounter for screening mammogram for malignant neoplasm of breast: Secondary | ICD-10-CM

## 2017-09-24 DIAGNOSIS — Z780 Asymptomatic menopausal state: Secondary | ICD-10-CM | POA: Diagnosis not present

## 2017-09-24 DIAGNOSIS — M810 Age-related osteoporosis without current pathological fracture: Secondary | ICD-10-CM

## 2018-02-20 DIAGNOSIS — Z961 Presence of intraocular lens: Secondary | ICD-10-CM | POA: Diagnosis not present

## 2018-02-20 DIAGNOSIS — H52203 Unspecified astigmatism, bilateral: Secondary | ICD-10-CM | POA: Diagnosis not present

## 2018-02-20 DIAGNOSIS — H26492 Other secondary cataract, left eye: Secondary | ICD-10-CM | POA: Diagnosis not present

## 2018-04-23 DIAGNOSIS — R7301 Impaired fasting glucose: Secondary | ICD-10-CM | POA: Diagnosis not present

## 2018-04-23 DIAGNOSIS — M810 Age-related osteoporosis without current pathological fracture: Secondary | ICD-10-CM | POA: Diagnosis not present

## 2018-04-23 DIAGNOSIS — E7800 Pure hypercholesterolemia, unspecified: Secondary | ICD-10-CM | POA: Diagnosis not present

## 2018-04-23 DIAGNOSIS — I10 Essential (primary) hypertension: Secondary | ICD-10-CM | POA: Diagnosis not present

## 2018-04-23 DIAGNOSIS — Z23 Encounter for immunization: Secondary | ICD-10-CM | POA: Diagnosis not present

## 2018-09-02 ENCOUNTER — Ambulatory Visit: Payer: Medicare HMO | Admitting: Interventional Cardiology

## 2018-09-02 DIAGNOSIS — M810 Age-related osteoporosis without current pathological fracture: Secondary | ICD-10-CM | POA: Diagnosis not present

## 2018-09-02 DIAGNOSIS — E7800 Pure hypercholesterolemia, unspecified: Secondary | ICD-10-CM | POA: Diagnosis not present

## 2018-09-02 DIAGNOSIS — R32 Unspecified urinary incontinence: Secondary | ICD-10-CM | POA: Diagnosis not present

## 2018-09-02 DIAGNOSIS — Z0000 Encounter for general adult medical examination without abnormal findings: Secondary | ICD-10-CM | POA: Diagnosis not present

## 2018-09-02 DIAGNOSIS — R5383 Other fatigue: Secondary | ICD-10-CM | POA: Diagnosis not present

## 2018-09-02 DIAGNOSIS — I10 Essential (primary) hypertension: Secondary | ICD-10-CM | POA: Diagnosis not present

## 2018-09-02 DIAGNOSIS — R7301 Impaired fasting glucose: Secondary | ICD-10-CM | POA: Diagnosis not present

## 2018-09-02 DIAGNOSIS — I351 Nonrheumatic aortic (valve) insufficiency: Secondary | ICD-10-CM | POA: Diagnosis not present

## 2018-09-02 DIAGNOSIS — I4891 Unspecified atrial fibrillation: Secondary | ICD-10-CM | POA: Diagnosis not present

## 2018-09-02 DIAGNOSIS — I340 Nonrheumatic mitral (valve) insufficiency: Secondary | ICD-10-CM | POA: Diagnosis not present

## 2018-09-02 NOTE — Progress Notes (Deleted)
Cardiology Office Note   Date:  09/02/2018   ID:  Stephanie [name], DOB [birth year], MRN [id number]  PCP:  Juluis Rainier, MD    No chief complaint on file.    Wt Readings from Last 3 Encounters:  08/16/17 154 lb 12.8 oz (70.2 kg)  07/24/16 151 lb 12.8 oz (68.9 kg)  07/29/15 155 lb 6.4 oz (70.5 kg)       History of Present Illness: Stephanie [name] is a 85 y.o. female  with HTN, AFib and high cholesterol. She has been feeling well except for fatigue. Has never had sx of AFib in the past.    She gets leg swelling with eating more salt, some chronic edema.  She has 8 children, 4 girls and 4 boys. Closest is in [facility], [place].  She has had issues with incontinence.     Past Medical History:  Diagnosis Date  . Atrial fibrillation (HCC)   . HTN (hypertension)   . Hypercholesteremia   . Osteoporosis   . Pain in joint     No past surgical history on file.   Current Outpatient Medications  Medication Sig Dispense Refill  . aspirin 81 MG tablet Take 81 mg by mouth daily.     Marland Kitchen atorvastatin (LIPITOR) 10 MG tablet Take 10 mg by mouth. 1/2 tablet daily    . calcium-vitamin D 250-100 MG-UNIT per tablet Take 1 tablet by mouth daily.     . hydrochlorothiazide (HYDRODIURIL) 25 MG tablet Take 25 mg by mouth daily.    . metoprolol succinate (TOPROL-XL) 25 MG 24 hr tablet Take 25 mg by mouth daily.    . Multiple Vitamins-Minerals (CENTRUM SILVER PO) Take 1 tablet by mouth daily.     . NON FORMULARY Take 1 tablet by mouth 3 (three) times a week. Stool softner    . tolterodine (DETROL) 2 MG tablet Take 1 tablet (2 mg total) by mouth 3 (three) times a week. 15 tablet 5   No current facility-administered medications for this visit.     Allergies:   Patient has no known allergies.    Social History:  The patient  reports that she has never smoked. She has never used smokeless tobacco. She reports that she does not drink alcohol or use drugs.   Family History:  The patient's  ***Family history is unknown by patient.    ROS:  Please see the history of present illness.   Otherwise, review of systems are positive for ***.   All other systems are reviewed and negative.    PHYSICAL EXAM: VS:  There were no vitals taken for this visit. , BMI There is no height or weight on file to calculate BMI. GEN: Well nourished, well developed, in no acute distress  HEENT: normal  Neck: no JVD, carotid bruits, or masses Cardiac: ***RRR; no murmurs, rubs, or gallops,no edema  Respiratory:  clear to auscultation bilaterally, normal work of breathing GI: soft, nontender, nondistended, + BS MS: no deformity or atrophy  Skin: warm and dry, no rash Neuro:  Strength and sensation are intact Psych: euthymic mood, full affect   EKG:   The ekg ordered today demonstrates ***   Recent Labs: No results found for requested labs within last 8760 hours.   Lipid Panel No results found for: CHOL, TRIG, HDL, CHOLHDL, VLDL, LDLCALC, LDLDIRECT   Other studies Reviewed: Additional studies/ records that were reviewed today with results demonstrating: ***.   ASSESSMENT AND PLAN:  1. AFib: aspirin  for stroke prentetion.  SHe has declined anticoagulation in the past.  2. HTN: 3. Hyperlipidemia: 4. LE edema:   Current medicines are reviewed at length with the patient today.  The patient concerns regarding her medicines were addressed.  The following changes have been made:  No change***  Labs/ tests ordered today include: *** No orders of the defined types were placed in this encounter.   Recommend 150 minutes/week of aerobic exercise Low fat, low carb, high fiber diet recommended  Disposition:   FU in ***   Signed, Lance Muss, MD  09/02/2018 8:23 AM    [facility] [address] [place], [place], [place]  [address] Phone: [phone]; Fax: [phone]

## 2018-10-16 ENCOUNTER — Telehealth: Payer: Self-pay

## 2018-10-16 NOTE — Telephone Encounter (Signed)
Called and  Spoke to patient and made her aware that there are no interactions with her current medications. Made patient aware that tolterodine can cause dizziness and drowsiness. Instructed patient to monitor for these symptoms. Patient verbalized understanding and thanked me for the call.

## 2018-10-16 NOTE — Telephone Encounter (Signed)
Pt had a question about one of her medications tolterodine(DETROL) 2mg  tab. She wants to know if it is ok to take this med or if it will affect her bp. She would like a call back. [phone]

## 2018-10-16 NOTE — Telephone Encounter (Signed)
Virtual Visit Pre-Appointment Phone Call  Steps For Call:  1. Confirm consent - "In the setting of the current Covid19 crisis, you are scheduled for a (phone or video) visit with your provider on (date) at (time).  Just as we do with many in-office visits, in order for you to participate in this visit, we must obtain consent.  If you'd like, I can send this to your mychart (if signed up) or email for you to review.  Otherwise, I can obtain your verbal consent now.  All virtual visits are billed to your insurance company just like a normal visit would be.  By agreeing to a virtual visit, we'd like you to understand that the technology does not allow for your provider to perform an examination, and thus may limit your provider's ability to fully assess your condition.  Finally, though the technology is pretty good, we cannot assure that it will always work on either your or our end, and in the setting of a video visit, we may have to convert it to a phone-only visit.  In either situation, we cannot ensure that we have a secure connection.  Are you willing to proceed?" STAFF: Did the patient verbally acknowledge consent to telehealth visit? yes  2. Confirm the BEST phone number to call the day of the visit by including in appointment notes  3. Give patient instructions for WebEx/MyChart download to smartphone as below or Doximity/Doxy.me if video visit (depending on what platform provider is using)  4. Advise patient to be prepared with their blood pressure, heart rate, weight, any heart rhythm information, their current medicines, and a piece of paper and pen handy for any instructions they may receive the day of their visit  5. Inform patient they will receive a phone call 15 minutes prior to their appointment time (may be from unknown caller ID) so they should be prepared to answer  6. Confirm that appointment type is correct in Epic appointment notes (VIDEO vs PHONE)     TELEPHONE CALL NOTE   Stephanie [name] has been deemed a candidate for a follow-up tele-health visit to limit community exposure during the Covid-19 pandemic. I spoke with the patient via phone to ensure availability of phone/video source, confirm preferred email & phone number, and discuss instructions and expectations.  I reminded Stephanie [name] to be prepared with any vital sign and/or heart rhythm information that could potentially be obtained via home monitoring, at the time of her visit. I reminded Stephanie [name] to expect a phone call at the time of her visit if her visit.  Clide DalesDanielle M Romeka Scifres, CMA 10/16/2018 3:33 PM   INSTRUCTIONS FOR DOWNLOADING THE WEBEX APP TO SMARTPHONE  - If Apple, ask patient to go to [employer] Store and type in WebEx in the search bar. Download [employer]ex Meetings, the blue/green circle. If Android, go to [employer] Play Store and type in [employer] in the search bar. The app is free but as with any other app downloads, their phone may require them to verify saved payment information or Apple/Android password.  - The patient does NOT have to create an account. - On the day of the visit, the assist will walk the patient through joining the meeting with the meeting number/password.  INSTRUCTIONS FOR DOWNLOADING THE MYCHART APP TO SMARTPHONE  - The patient must first make sure to have activated MyChart and know their login information - If Apple, go to [employer] Store and type in MyChart in  the search bar and download the app. If Android, ask patient to go to [employer] and type in [place] [facility] in the search bar and download the app. The app is free but as with any other app downloads, their phone may require them to verify saved payment information or Apple/Android password.  - The patient will need to then log into the app with their MyChart username and password, and select [facility] as their healthcare provider to link the account. When it is time for your visit, go to the MyChart app, find appointments, and  click Begin Video Visit. Be sure to Select Allow for your device to access the Microphone and Camera for your visit. You will then be connected, and your provider will be with you shortly.  **If they have any issues connecting, or need assistance please contact MyChart service desk [phone])**  **If using a computer, in order to ensure the best quality for their visit they will need to use either of the following Internet Browsers: [employer], or Google Chrome**  IF USING DOXIMITY or DOXY.ME - The patient will receive a link just prior to their visit, either by text or email (to be determined day of appointment depending on if it's doxy.me or Doximity).     FULL LENGTH CONSENT FOR TELE-HEALTH VISIT   I hereby voluntarily request, consent and authorize [facility] and its employed or contracted physicians, physician assistants, nurse practitioners or other licensed health care professionals (the Practitioner), to provide me with telemedicine health care services (the "Services") as deemed necessary by the treating Practitioner. I acknowledge and consent to receive the Services by the Practitioner via telemedicine. I understand that the telemedicine visit will involve communicating with the Practitioner through live audiovisual communication technology and the disclosure of certain medical information by electronic transmission. I acknowledge that I have been given the opportunity to request an in-person assessment or other available alternative prior to the telemedicine visit and am voluntarily participating in the telemedicine visit.  I understand that I have the right to withhold or withdraw my consent to the use of telemedicine in the course of my care at any time, without affecting my right to future care or treatment, and that the Practitioner or I may terminate the telemedicine visit at any time. I understand that I have the right to inspect all information obtained and/or  recorded in the course of the telemedicine visit and may receive copies of available information for a reasonable fee.  I understand that some of the potential risks of receiving the Services via telemedicine include:  Marland Kitchen Delay or interruption in medical evaluation due to technological equipment failure or disruption; . Information transmitted may not be sufficient (e.g. poor resolution of images) to allow for appropriate medical decision making by the Practitioner; and/or  . In rare instances, security protocols could fail, causing a breach of personal health information.  Furthermore, I acknowledge that it is my responsibility to provide information about my medical history, conditions and care that is complete and accurate to the best of my ability. I acknowledge that Practitioner's advice, recommendations, and/or decision may be based on factors not within their control, such as incomplete or inaccurate data provided by me or distortions of diagnostic images or specimens that may result from electronic transmissions. I understand that the practice of medicine is not an exact science and that Practitioner makes no warranties or guarantees regarding treatment outcomes. I acknowledge that I will receive a copy of this  consent concurrently upon execution via email to the email address I last provided but may also request a printed copy by calling the office of [facility].    I understand that my insurance will be billed for this visit.   I have read or had this consent read to me. . I understand the contents of this consent, which adequately explains the benefits and risks of the Services being provided via telemedicine.  . I have been provided ample opportunity to ask questions regarding this consent and the Services and have had my questions answered to my satisfaction. . I give my informed consent for the services to be provided through the use of telemedicine in my medical care  By participating  in this telemedicine visit I agree to the above.

## 2018-10-16 NOTE — Telephone Encounter (Signed)
Will send to PharmD to see if tolterodine will interact with any of her cardiac meds.

## 2018-10-16 NOTE — Telephone Encounter (Signed)
Called pt to set up possible evisit. Left message asking pt to call the office.  

## 2018-10-16 NOTE — Telephone Encounter (Signed)
There are no interactions with her current medications - tolterodine can cause dizziness and drowsiness - would have pt monitor for these symptoms.

## 2018-10-23 NOTE — Progress Notes (Signed)
Virtual Visit via Video Note   This visit type was conducted due to national recommendations for restrictions regarding the COVID-19 Pandemic (e.g. social distancing) in an effort to limit this patient's exposure and mitigate transmission in our community.  Due to her co-morbid illnesses, this patient is at least at moderate risk for complications without adequate follow up.  This format is felt to be most appropriate for this patient at this time.  All issues noted in this document were discussed and addressed.  A limited physical exam was performed with this format.  Please refer to the patient's chart for her consent to telehealth for [facility].   Evaluation Performed:  Follow-up visit  Date:  10/24/2018   ID:  Stephanie [name], DOB 07-10-1923, MRN [id number]  Patient Location: Home Provider Location: Home  PCP:  Juluis Rainier, MD  Cardiologist:  Lance Muss, MD  Electrophysiologist:  None   Chief Complaint:  PAF  History of Present Illness:    Stephanie [name] is a 85 y.o. female with with HTN, AFib and high cholesterol. She has been feeling well except for fatigue. Has never had sx of AFib in the past.    She gets leg swelling with eating more salt.  She has 8 children, 4 girls and 4 boys. Closest is in [facility], [place].  She has concerns about incontinence.  Regarding oxybutinin, she would have to watch for sx of lightheadedness and orthostatic hypotension.  .    The patient does not have symptoms concerning for COVID-19 infection (fever, chills, cough, or new shortness of breath).   Denies : Chest pain. Dizziness. Leg edema. Nitroglycerin use. Orthopnea. Palpitations. Paroxysmal nocturnal dyspnea. Shortness of breath. Syncope.   She is staying at her son's house.     Past Medical History:  Diagnosis Date  . Atrial fibrillation (HCC)   . HTN (hypertension)   . Hypercholesteremia   . Osteoporosis   . Pain in joint    No past surgical history on file.    Current Meds  Medication Sig  . aspirin 81 MG tablet Take 81 mg by mouth daily.   Marland Kitchen atorvastatin (LIPITOR) 10 MG tablet Take 10 mg by mouth. 1/2 tablet daily  . calcium-vitamin D 250-100 MG-UNIT per tablet Take 1 tablet by mouth daily.   . hydrochlorothiazide (HYDRODIURIL) 25 MG tablet Take 25 mg by mouth daily.  . metoprolol succinate (TOPROL-XL) 25 MG 24 hr tablet Take 25 mg by mouth daily.  . Multiple Vitamins-Minerals (CENTRUM SILVER PO) Take 1 tablet by mouth daily.   . NON FORMULARY Take 1 tablet by mouth 3 (three) times a week. Stool softner  . tolterodine (DETROL) 2 MG tablet Take 1 tablet (2 mg total) by mouth 3 (three) times a week.     Allergies:   Patient has no known allergies.   Social History   Tobacco Use  . Smoking status: Never Smoker  . Smokeless tobacco: Never Used  Substance Use Topics  . Alcohol use: No    Alcohol/week: 0.0 standard drinks    Frequency: Never  . Drug use: No     Family Hx: The patient's Family history is unknown by patient.  ROS:   Please see the history of present illness.     All other systems reviewed and are negative.   Prior CV studies:   The following studies were reviewed today:  Labs reviewed  Labs/Other Tests and Data Reviewed:    EKG:  An ECG dated 2/19  was personally reviewed today and demonstrated:  normal sinus rhythm  Recent Labs: No results found for requested labs within last 8760 hours.   Recent Lipid Panel No results found for: CHOL, TRIG, HDL, CHOLHDL, LDLCALC, LDLDIRECT  Wt Readings from Last 3 Encounters:  10/24/18 149 lb (67.6 kg)  08/16/17 154 lb 12.8 oz (70.2 kg)  07/24/16 151 lb 12.8 oz (68.9 kg)     Objective:    Vital Signs:  Ht 4\' 11"  (1.499 m)   Wt 149 lb (67.6 kg)   BMI 30.09 kg/m    VITAL SIGNS:  reviewed GEN:  no acute distress RESPIRATORY:  normal respiratory effort, symmetric expansion PSYCH:  normal affect exam limited by video format  ASSESSMENT & PLAN:    1. AFib: No  palpitations.  On aspirin.  No bleeding problems.  She has been steady on her feet.  She does walk some and has not fallen.  I stressed the importance of avoiding falls. 2. Hypertension.: 130/70 at the chiropractor 3. Hyperlipidemia: LDL 76, HDL 55, triglycerides 162, A1c 6.0 in March 2020. 4. LE edema: Stable.  Continue leg elevation.  COVID-19 Education: The signs and symptoms of COVID-19 were discussed with the patient and how to seek care for testing (follow up with PCP or arrange E-visit).  The importance of social distancing was discussed today.  Time:   Today, I have spent 25 minutes with the patient with telehealth technology discussing the above problems.     Medication Adjustments/Labs and Tests Ordered: Current medicines are reviewed at length with the patient today.  Concerns regarding medicines are outlined above.   Tests Ordered: No orders of the defined types were placed in this encounter.   Medication Changes: No orders of the defined types were placed in this encounter.   Disposition:  Follow up in 1 year(s)  Signed, Lance MussJayadeep Thelbert Gartin, MD  10/24/2018 9:45 AM    [facility]

## 2018-10-24 ENCOUNTER — Encounter: Payer: Self-pay | Admitting: Interventional Cardiology

## 2018-10-24 ENCOUNTER — Telehealth (INDEPENDENT_AMBULATORY_CARE_PROVIDER_SITE_OTHER): Payer: Medicare HMO | Admitting: Interventional Cardiology

## 2018-10-24 ENCOUNTER — Other Ambulatory Visit: Payer: Self-pay

## 2018-10-24 VITALS — Ht 59.0 in | Wt 149.0 lb

## 2018-10-24 DIAGNOSIS — M25472 Effusion, left ankle: Secondary | ICD-10-CM | POA: Diagnosis not present

## 2018-10-24 DIAGNOSIS — E7800 Pure hypercholesterolemia, unspecified: Secondary | ICD-10-CM

## 2018-10-24 DIAGNOSIS — I480 Paroxysmal atrial fibrillation: Secondary | ICD-10-CM | POA: Diagnosis not present

## 2018-10-24 DIAGNOSIS — M25471 Effusion, right ankle: Secondary | ICD-10-CM

## 2018-10-24 DIAGNOSIS — I10 Essential (primary) hypertension: Secondary | ICD-10-CM | POA: Diagnosis not present

## 2018-10-24 NOTE — Patient Instructions (Signed)

## 2019-02-27 DIAGNOSIS — B354 Tinea corporis: Secondary | ICD-10-CM | POA: Diagnosis not present

## 2019-03-04 DIAGNOSIS — I4891 Unspecified atrial fibrillation: Secondary | ICD-10-CM | POA: Diagnosis not present

## 2019-03-04 DIAGNOSIS — M810 Age-related osteoporosis without current pathological fracture: Secondary | ICD-10-CM | POA: Diagnosis not present

## 2019-03-04 DIAGNOSIS — E7800 Pure hypercholesterolemia, unspecified: Secondary | ICD-10-CM | POA: Diagnosis not present

## 2019-03-04 DIAGNOSIS — I10 Essential (primary) hypertension: Secondary | ICD-10-CM | POA: Diagnosis not present

## 2019-03-04 DIAGNOSIS — R7301 Impaired fasting glucose: Secondary | ICD-10-CM | POA: Diagnosis not present

## 2019-03-04 DIAGNOSIS — R32 Unspecified urinary incontinence: Secondary | ICD-10-CM | POA: Diagnosis not present

## 2019-03-07 DIAGNOSIS — M810 Age-related osteoporosis without current pathological fracture: Secondary | ICD-10-CM | POA: Diagnosis not present

## 2019-03-07 DIAGNOSIS — I4891 Unspecified atrial fibrillation: Secondary | ICD-10-CM | POA: Diagnosis not present

## 2019-03-07 DIAGNOSIS — I10 Essential (primary) hypertension: Secondary | ICD-10-CM | POA: Diagnosis not present

## 2019-03-07 DIAGNOSIS — R32 Unspecified urinary incontinence: Secondary | ICD-10-CM | POA: Diagnosis not present

## 2019-03-07 DIAGNOSIS — R7301 Impaired fasting glucose: Secondary | ICD-10-CM | POA: Diagnosis not present

## 2019-03-07 DIAGNOSIS — E7800 Pure hypercholesterolemia, unspecified: Secondary | ICD-10-CM | POA: Diagnosis not present

## 2019-03-18 DIAGNOSIS — N9089 Other specified noninflammatory disorders of vulva and perineum: Secondary | ICD-10-CM | POA: Diagnosis not present

## 2019-03-18 DIAGNOSIS — N952 Postmenopausal atrophic vaginitis: Secondary | ICD-10-CM | POA: Diagnosis not present

## 2019-03-24 DIAGNOSIS — M810 Age-related osteoporosis without current pathological fracture: Secondary | ICD-10-CM | POA: Diagnosis not present

## 2019-03-24 DIAGNOSIS — E7800 Pure hypercholesterolemia, unspecified: Secondary | ICD-10-CM | POA: Diagnosis not present

## 2019-03-24 DIAGNOSIS — I4891 Unspecified atrial fibrillation: Secondary | ICD-10-CM | POA: Diagnosis not present

## 2019-03-24 DIAGNOSIS — I10 Essential (primary) hypertension: Secondary | ICD-10-CM | POA: Diagnosis not present

## 2019-03-24 DIAGNOSIS — Z961 Presence of intraocular lens: Secondary | ICD-10-CM | POA: Diagnosis not present

## 2019-04-03 DIAGNOSIS — E7800 Pure hypercholesterolemia, unspecified: Secondary | ICD-10-CM | POA: Diagnosis not present

## 2019-04-03 DIAGNOSIS — H02831 Dermatochalasis of right upper eyelid: Secondary | ICD-10-CM | POA: Diagnosis not present

## 2019-04-03 DIAGNOSIS — Z135 Encounter for screening for eye and ear disorders: Secondary | ICD-10-CM | POA: Diagnosis not present

## 2019-04-03 DIAGNOSIS — H5210 Myopia, unspecified eye: Secondary | ICD-10-CM | POA: Diagnosis not present

## 2019-04-03 DIAGNOSIS — H26493 Other secondary cataract, bilateral: Secondary | ICD-10-CM | POA: Diagnosis not present

## 2019-04-03 DIAGNOSIS — Z961 Presence of intraocular lens: Secondary | ICD-10-CM | POA: Diagnosis not present

## 2019-04-03 DIAGNOSIS — H02834 Dermatochalasis of left upper eyelid: Secondary | ICD-10-CM | POA: Diagnosis not present

## 2019-04-03 DIAGNOSIS — I10 Essential (primary) hypertension: Secondary | ICD-10-CM | POA: Diagnosis not present

## 2019-04-15 DIAGNOSIS — B351 Tinea unguium: Secondary | ICD-10-CM | POA: Diagnosis not present

## 2019-04-15 DIAGNOSIS — M2041 Other hammer toe(s) (acquired), right foot: Secondary | ICD-10-CM | POA: Diagnosis not present

## 2019-04-15 DIAGNOSIS — L608 Other nail disorders: Secondary | ICD-10-CM | POA: Diagnosis not present

## 2019-04-15 DIAGNOSIS — R600 Localized edema: Secondary | ICD-10-CM | POA: Diagnosis not present

## 2019-04-15 DIAGNOSIS — M2042 Other hammer toe(s) (acquired), left foot: Secondary | ICD-10-CM | POA: Diagnosis not present

## 2019-04-15 DIAGNOSIS — L859 Epidermal thickening, unspecified: Secondary | ICD-10-CM | POA: Diagnosis not present

## 2019-04-21 DIAGNOSIS — H401123 Primary open-angle glaucoma, left eye, severe stage: Secondary | ICD-10-CM | POA: Diagnosis not present

## 2019-04-21 DIAGNOSIS — H52203 Unspecified astigmatism, bilateral: Secondary | ICD-10-CM | POA: Diagnosis not present

## 2019-04-21 DIAGNOSIS — H26492 Other secondary cataract, left eye: Secondary | ICD-10-CM | POA: Diagnosis not present

## 2019-05-06 DIAGNOSIS — H26492 Other secondary cataract, left eye: Secondary | ICD-10-CM | POA: Diagnosis not present

## 2019-05-20 DIAGNOSIS — Z0100 Encounter for examination of eyes and vision without abnormal findings: Secondary | ICD-10-CM | POA: Diagnosis not present

## 2019-06-12 DIAGNOSIS — Z961 Presence of intraocular lens: Secondary | ICD-10-CM | POA: Diagnosis not present

## 2019-06-12 DIAGNOSIS — I10 Essential (primary) hypertension: Secondary | ICD-10-CM | POA: Diagnosis not present

## 2019-06-12 DIAGNOSIS — M810 Age-related osteoporosis without current pathological fracture: Secondary | ICD-10-CM | POA: Diagnosis not present

## 2019-06-12 DIAGNOSIS — E7800 Pure hypercholesterolemia, unspecified: Secondary | ICD-10-CM | POA: Diagnosis not present

## 2019-06-12 DIAGNOSIS — I4891 Unspecified atrial fibrillation: Secondary | ICD-10-CM | POA: Diagnosis not present

## 2019-08-12 DIAGNOSIS — M2042 Other hammer toe(s) (acquired), left foot: Secondary | ICD-10-CM | POA: Diagnosis not present

## 2019-08-12 DIAGNOSIS — B351 Tinea unguium: Secondary | ICD-10-CM | POA: Diagnosis not present

## 2019-08-12 DIAGNOSIS — E7800 Pure hypercholesterolemia, unspecified: Secondary | ICD-10-CM | POA: Diagnosis not present

## 2019-08-12 DIAGNOSIS — M810 Age-related osteoporosis without current pathological fracture: Secondary | ICD-10-CM | POA: Diagnosis not present

## 2019-08-12 DIAGNOSIS — M2041 Other hammer toe(s) (acquired), right foot: Secondary | ICD-10-CM | POA: Diagnosis not present

## 2019-08-12 DIAGNOSIS — Z961 Presence of intraocular lens: Secondary | ICD-10-CM | POA: Diagnosis not present

## 2019-08-12 DIAGNOSIS — I4891 Unspecified atrial fibrillation: Secondary | ICD-10-CM | POA: Diagnosis not present

## 2019-08-12 DIAGNOSIS — L608 Other nail disorders: Secondary | ICD-10-CM | POA: Diagnosis not present

## 2019-08-12 DIAGNOSIS — I10 Essential (primary) hypertension: Secondary | ICD-10-CM | POA: Diagnosis not present

## 2019-08-12 DIAGNOSIS — R600 Localized edema: Secondary | ICD-10-CM | POA: Diagnosis not present

## 2019-09-18 DIAGNOSIS — E7800 Pure hypercholesterolemia, unspecified: Secondary | ICD-10-CM | POA: Diagnosis not present

## 2019-09-18 DIAGNOSIS — I4891 Unspecified atrial fibrillation: Secondary | ICD-10-CM | POA: Diagnosis not present

## 2019-09-18 DIAGNOSIS — Z961 Presence of intraocular lens: Secondary | ICD-10-CM | POA: Diagnosis not present

## 2019-09-18 DIAGNOSIS — I10 Essential (primary) hypertension: Secondary | ICD-10-CM | POA: Diagnosis not present

## 2019-09-18 DIAGNOSIS — M810 Age-related osteoporosis without current pathological fracture: Secondary | ICD-10-CM | POA: Diagnosis not present

## 2019-09-22 DIAGNOSIS — M810 Age-related osteoporosis without current pathological fracture: Secondary | ICD-10-CM | POA: Diagnosis not present

## 2019-09-22 DIAGNOSIS — E7800 Pure hypercholesterolemia, unspecified: Secondary | ICD-10-CM | POA: Diagnosis not present

## 2019-09-22 DIAGNOSIS — R7301 Impaired fasting glucose: Secondary | ICD-10-CM | POA: Diagnosis not present

## 2019-09-22 DIAGNOSIS — H401123 Primary open-angle glaucoma, left eye, severe stage: Secondary | ICD-10-CM | POA: Diagnosis not present

## 2019-09-22 DIAGNOSIS — Z961 Presence of intraocular lens: Secondary | ICD-10-CM | POA: Diagnosis not present

## 2019-09-22 DIAGNOSIS — H349 Unspecified retinal vascular occlusion: Secondary | ICD-10-CM | POA: Diagnosis not present

## 2019-10-24 DIAGNOSIS — I351 Nonrheumatic aortic (valve) insufficiency: Secondary | ICD-10-CM | POA: Diagnosis not present

## 2019-10-24 DIAGNOSIS — E7800 Pure hypercholesterolemia, unspecified: Secondary | ICD-10-CM | POA: Diagnosis not present

## 2019-10-24 DIAGNOSIS — I4891 Unspecified atrial fibrillation: Secondary | ICD-10-CM | POA: Diagnosis not present

## 2019-10-24 DIAGNOSIS — I340 Nonrheumatic mitral (valve) insufficiency: Secondary | ICD-10-CM | POA: Diagnosis not present

## 2019-10-24 DIAGNOSIS — Z0000 Encounter for general adult medical examination without abnormal findings: Secondary | ICD-10-CM | POA: Diagnosis not present

## 2019-10-24 DIAGNOSIS — M810 Age-related osteoporosis without current pathological fracture: Secondary | ICD-10-CM | POA: Diagnosis not present

## 2019-10-24 DIAGNOSIS — R7301 Impaired fasting glucose: Secondary | ICD-10-CM | POA: Diagnosis not present

## 2019-10-24 DIAGNOSIS — I10 Essential (primary) hypertension: Secondary | ICD-10-CM | POA: Diagnosis not present

## 2019-10-24 DIAGNOSIS — I700 Atherosclerosis of aorta: Secondary | ICD-10-CM | POA: Diagnosis not present

## 2019-10-26 NOTE — Progress Notes (Signed)
Cardiology Office Note   Date:  10/27/2019   ID:  Stephanie [name], DOB 1923-07-08, MRN [id number]  PCP:  Juluis Rainier, MD    No chief complaint on file.  Paroxysmal atrial fibrillation  Wt Readings from Last 3 Encounters:  10/27/19 138 lb 3.2 oz (62.7 kg)  10/24/18 149 lb (67.6 kg)  08/16/17 154 lb 12.8 oz (70.2 kg)       History of Present Illness: Stephanie [name] is a 86 y.o. female with hypertension, atrial fibrillation and high cholesterol.  She has 8 children, 4 girls and 4 boys.  Closest child is in [facility] [place].  In the past, she had concerns about incontinence.  Oxybutynin was suggested and she was instructed to watch for symptoms of lightheadedness and orthostatic hypotension.  At the last visit, she was staying at her son's house.  She is back home now.  She has had both COVID vaccines.  Denies : Chest pain. Dizziness. Leg edema. Nitroglycerin use. Orthopnea. Palpitations. Paroxysmal nocturnal dyspnea. Shortness of breath. Syncope.     Past Medical History:  Diagnosis Date  . Atrial fibrillation (HCC)   . HTN (hypertension)   . Hypercholesteremia   . Osteoporosis   . Pain in joint     No past surgical history on file.   Current Outpatient Medications  Medication Sig Dispense Refill  . alendronate (FOSAMAX) 70 MG tablet     . aspirin 81 MG tablet Take 81 mg by mouth daily.     Marland Kitchen atorvastatin (LIPITOR) 10 MG tablet Take 10 mg by mouth. 1/2 tablet daily    . calcium-vitamin D 250-100 MG-UNIT per tablet Take 1 tablet by mouth daily.     . hydrochlorothiazide (HYDRODIURIL) 25 MG tablet Take 25 mg by mouth daily.    Marland Kitchen latanoprost (XALATAN) 0.005 % ophthalmic solution     . metoprolol succinate (TOPROL-XL) 25 MG 24 hr tablet Take 25 mg by mouth daily.    . Multiple Vitamins-Minerals (CENTRUM SILVER PO) Take 1 tablet by mouth daily.     . NON FORMULARY Take 1 tablet by mouth 3 (three) times a week. Stool softner    . tolterodine (DETROL)  2 MG tablet Take 1 tablet (2 mg total) by mouth 3 (three) times a week. 15 tablet 5   No current facility-administered medications for this visit.    Allergies:   Patient has no known allergies.    Social History:  The patient  reports that she has never smoked. She has never used smokeless tobacco. She reports that she does not drink alcohol or use drugs.   Family History:  The patient's Family history is unknown by patient.    ROS:  Please see the history of present illness.   Otherwise, review of systems are positive for ankle edema.   All other systems are reviewed and negative.    PHYSICAL EXAM: VS:  BP 130/62   Pulse 67   Ht 4\' 11"  (1.499 m)   Wt 138 lb 3.2 oz (62.7 kg)   SpO2 97%   BMI 27.91 kg/m  , BMI Body mass index is 27.91 kg/m. GEN: Well nourished, well developed, in no acute distress  HEENT: normal  Neck: no JVD, carotid bruits, or masses Cardiac: RRR; no murmurs, rubs, or gallops,; 1+ bilateral ankle/pretibial edema  Respiratory:  clear to auscultation bilaterally, normal work of breathing GI: soft, nontender, nondistended, + BS MS: no deformity or atrophy  Skin: warm and dry, no  rash Neuro:  Strength and sensation are intact Psych: euthymic mood, full affect   EKG:   The ekg ordered today demonstrates NSR, prolonged PR interval, no ST changes   Recent Labs: No results found for requested labs within last 8760 hours.   Lipid Panel No results found for: CHOL, TRIG, HDL, CHOLHDL, VLDL, LDLCALC, LDLDIRECT   Other studies Reviewed: Additional studies/ records that were reviewed today with results demonstrating:  .   ASSESSMENT AND PLAN:  1. Atrial fibrillation: She has been on aspirin.  We talked about trying to avoid falls. 2. Hypertension: The current medical regimen is effective;  continue present plan and medications.  COntinue check BP at home.  3. Hyperlipidemia: TC 174 in 3/21.  Continue atorvastatin. 4. Lower extremity edema: Continue leg  elevation.  She has compression stocking but does not use them.  We spoke about switching to Lasix, but she does not want to deal with the increase in urination.  She does not find the edema to be bothersome, but she will let us know if this changes.    Current medicines are reviewed at length with the patient today.  The patient concerns regarding her medicines were addressed.  The following changes have been made:  No change  Labs/ tests ordered today include:  No orders of the defined types were placed in this encounter.   Recommend 150 minutes/week of aerobic exercise Low fat, low carb, high fiber diet recommended  Disposition:   FU in 1 year   Signed, Larae Grooms, MD  10/27/2019 11:17 AM    [address], [place], [place]  [address] Phone: [phone]; Fax: [phone]

## 2019-10-27 ENCOUNTER — Ambulatory Visit: Payer: Medicare HMO | Admitting: Interventional Cardiology

## 2019-10-27 ENCOUNTER — Encounter: Payer: Self-pay | Admitting: Interventional Cardiology

## 2019-10-27 ENCOUNTER — Other Ambulatory Visit: Payer: Self-pay

## 2019-10-27 VITALS — BP 130/62 | HR 67 | Ht 59.0 in | Wt 138.2 lb

## 2019-10-27 DIAGNOSIS — M25471 Effusion, right ankle: Secondary | ICD-10-CM

## 2019-10-27 DIAGNOSIS — E7800 Pure hypercholesterolemia, unspecified: Secondary | ICD-10-CM | POA: Diagnosis not present

## 2019-10-27 DIAGNOSIS — I480 Paroxysmal atrial fibrillation: Secondary | ICD-10-CM

## 2019-10-27 DIAGNOSIS — I10 Essential (primary) hypertension: Secondary | ICD-10-CM

## 2019-10-27 DIAGNOSIS — M25472 Effusion, left ankle: Secondary | ICD-10-CM

## 2019-10-27 NOTE — Patient Instructions (Signed)
Medication Instructions:  Your physician recommends that you continue on your current medications as directed. Please refer to the Current Medication list given to you today.  *If you need a refill on your cardiac medications before your next appointment, please call your pharmacy*   Lab Work: None ordered  If you have labs (blood work) drawn today and your tests are completely normal, you will receive your results only by: Marland Kitchen MyChart Message (if you have MyChart) OR . A paper copy in the mail If you have any lab test that is abnormal or we need to change your treatment, we will call you to review the results.   Testing/Procedures: None ordered  Follow-Up: At [facility], you and your health needs are our priority.  As part of our continuing mission to provide you with exceptional heart care, we have created designated Provider Care Teams.  These Care Teams include your primary Cardiologist (physician) and Advanced Practice Providers (APPs -  Physician Assistants and Nurse Practitioners) who all work together to provide you with the care you need, when you need it.  We recommend signing up for the patient portal called "MyChart".  Sign up information is provided on this After Visit Summary.  MyChart is used to connect with patients for Virtual Visits (Telemedicine).  Patients are able to view lab/test results, encounter notes, upcoming appointments, etc.  Non-urgent messages can be sent to your provider as well.   To learn more about what you can do with MyChart, go to [email or web address].    Your next appointment:   12 month(s)  The format for your next appointment:   In Person  Provider:   You may see Lance Muss, MD or one of the following Advanced Practice Providers on your designated Care Team:    Ronie Spies, PA-C  Jacolyn Reedy, PA-C    Other Instructions [facility]

## 2019-11-21 DIAGNOSIS — Z961 Presence of intraocular lens: Secondary | ICD-10-CM | POA: Diagnosis not present

## 2019-11-21 DIAGNOSIS — I4891 Unspecified atrial fibrillation: Secondary | ICD-10-CM | POA: Diagnosis not present

## 2019-11-21 DIAGNOSIS — M810 Age-related osteoporosis without current pathological fracture: Secondary | ICD-10-CM | POA: Diagnosis not present

## 2019-11-21 DIAGNOSIS — E7800 Pure hypercholesterolemia, unspecified: Secondary | ICD-10-CM | POA: Diagnosis not present

## 2019-11-21 DIAGNOSIS — I10 Essential (primary) hypertension: Secondary | ICD-10-CM | POA: Diagnosis not present

## 2020-02-12 DIAGNOSIS — M810 Age-related osteoporosis without current pathological fracture: Secondary | ICD-10-CM | POA: Diagnosis not present

## 2020-02-12 DIAGNOSIS — E7800 Pure hypercholesterolemia, unspecified: Secondary | ICD-10-CM | POA: Diagnosis not present

## 2020-02-12 DIAGNOSIS — Z961 Presence of intraocular lens: Secondary | ICD-10-CM | POA: Diagnosis not present

## 2020-02-12 DIAGNOSIS — I4891 Unspecified atrial fibrillation: Secondary | ICD-10-CM | POA: Diagnosis not present

## 2020-02-12 DIAGNOSIS — I10 Essential (primary) hypertension: Secondary | ICD-10-CM | POA: Diagnosis not present

## 2020-03-24 DIAGNOSIS — H401122 Primary open-angle glaucoma, left eye, moderate stage: Secondary | ICD-10-CM | POA: Diagnosis not present

## 2020-03-24 DIAGNOSIS — H26492 Other secondary cataract, left eye: Secondary | ICD-10-CM | POA: Diagnosis not present

## 2020-03-24 DIAGNOSIS — H349 Unspecified retinal vascular occlusion: Secondary | ICD-10-CM | POA: Diagnosis not present

## 2020-03-30 DIAGNOSIS — I10 Essential (primary) hypertension: Secondary | ICD-10-CM | POA: Diagnosis not present

## 2020-03-30 DIAGNOSIS — E7800 Pure hypercholesterolemia, unspecified: Secondary | ICD-10-CM | POA: Diagnosis not present

## 2020-03-30 DIAGNOSIS — M810 Age-related osteoporosis without current pathological fracture: Secondary | ICD-10-CM | POA: Diagnosis not present

## 2020-03-30 DIAGNOSIS — Z961 Presence of intraocular lens: Secondary | ICD-10-CM | POA: Diagnosis not present

## 2020-03-30 DIAGNOSIS — I4891 Unspecified atrial fibrillation: Secondary | ICD-10-CM | POA: Diagnosis not present

## 2020-04-02 ENCOUNTER — Encounter (HOSPITAL_COMMUNITY): Payer: Self-pay

## 2020-04-02 ENCOUNTER — Other Ambulatory Visit: Payer: Self-pay

## 2020-04-02 ENCOUNTER — Emergency Department (HOSPITAL_COMMUNITY)
Admission: EM | Admit: 2020-04-02 | Discharge: 2020-04-03 | Disposition: A | Discharge status: Home / Self Care | Payer: Medicare HMO | Attending: Emergency Medicine | Admitting: Emergency Medicine

## 2020-04-02 DIAGNOSIS — R42 Dizziness and giddiness: Secondary | ICD-10-CM | POA: Diagnosis not present

## 2020-04-02 DIAGNOSIS — R002 Palpitations: Secondary | ICD-10-CM | POA: Diagnosis not present

## 2020-04-02 DIAGNOSIS — Z5321 Procedure and treatment not carried out due to patient leaving prior to being seen by health care provider: Secondary | ICD-10-CM | POA: Diagnosis not present

## 2020-04-02 DIAGNOSIS — R55 Syncope and collapse: Secondary | ICD-10-CM | POA: Diagnosis not present

## 2020-04-02 DIAGNOSIS — R0602 Shortness of breath: Secondary | ICD-10-CM | POA: Diagnosis not present

## 2020-04-02 DIAGNOSIS — R0902 Hypoxemia: Secondary | ICD-10-CM | POA: Diagnosis not present

## 2020-04-02 DIAGNOSIS — R531 Weakness: Secondary | ICD-10-CM | POA: Insufficient documentation

## 2020-04-02 DIAGNOSIS — I4891 Unspecified atrial fibrillation: Secondary | ICD-10-CM | POA: Diagnosis not present

## 2020-04-02 DIAGNOSIS — I499 Cardiac arrhythmia, unspecified: Secondary | ICD-10-CM | POA: Diagnosis not present

## 2020-04-02 LAB — CBC
HCT: 41.2 % (ref 36.0–46.0)
Hemoglobin: 13.6 g/dL (ref 12.0–15.0)
MCH: 31.1 pg (ref 26.0–34.0)
MCHC: 33 g/dL (ref 30.0–36.0)
MCV: 94.1 fL (ref 80.0–100.0)
Platelets: 162 10*3/uL (ref 150–400)
RBC: 4.38 MIL/uL (ref 3.87–5.11)
RDW: 12.1 % (ref 11.5–15.5)
WBC: 7.4 10*3/uL (ref 4.0–10.5)
nRBC: 0 % (ref 0.0–0.2)

## 2020-04-02 LAB — BASIC METABOLIC PANEL
Anion gap: 11 (ref 5–15)
BUN: 23 mg/dL (ref 8–23)
CO2: 27 mmol/L (ref 22–32)
Calcium: 9.5 mg/dL (ref 8.9–10.3)
Chloride: 99 mmol/L (ref 98–111)
Creatinine, Ser: 0.79 mg/dL (ref 0.44–1.00)
GFR calc Af Amer: >60 mL/min (ref >60)
GFR calc non Af Amer: >60 mL/min (ref >60)
Glucose, Bld: 101 mg/dL — ABNORMAL HIGH (ref 70–99)
Potassium: 4.6 mmol/L (ref 3.5–5.1)
Sodium: 137 mmol/L (ref 135–145)

## 2020-04-02 LAB — TROPONIN I (HIGH SENSITIVITY): Troponin I (High Sensitivity): 26 ng/L — ABNORMAL HIGH (ref <18)

## 2020-04-02 NOTE — ED Triage Notes (Signed)
Pt from PCP office with ems for further evaluation of dizziness. Pt had an episode on Wednesday and again today. Pt has no complaints at this time except for exertional SOB that started today when she went for a walk.

## 2020-04-02 NOTE — ED Notes (Addendum)
Patient wants to leave with out seeing the doctor . IV HAS B EEN REMOVE.FAMILY TAKEN HER HOME

## 2020-04-05 ENCOUNTER — Telehealth: Payer: Self-pay | Admitting: Interventional Cardiology

## 2020-04-05 NOTE — Telephone Encounter (Signed)
    Pt would like to speak with Dr. Eldridge Dace or his nurse. She said she was in the ED last week and would like to discuss about her visit

## 2020-04-05 NOTE — Telephone Encounter (Signed)
Can we get the [facility] ECG where there was possible AFib?  If that was truly AFib, she may not need a monitor.

## 2020-04-05 NOTE — Telephone Encounter (Signed)
Called and spoke to the patient.   She states that on Friday she was playing cards and got very lightheaded and felt like she was going to pass out. Denies syncope, chest pain, SOB or any other Sx. She was taken to the [facility] clinic by her friend. She states that they did an EKG that showed possible Afib 93 bpm. She was sent to the ER for further evaluation. EKG in ER was NSR. She left before being seen by a provider. She states that she has been completely asymptomatic since the event. BP 135/66 HR 70. Will send message to Dr. Eldridge Dace to see if he would like to order a monitor. Instructed the patient to let us know if her Sx change or worsen.

## 2020-04-06 ENCOUNTER — Other Ambulatory Visit: Payer: Self-pay

## 2020-04-06 ENCOUNTER — Ambulatory Visit (INDEPENDENT_AMBULATORY_CARE_PROVIDER_SITE_OTHER): Payer: Medicare HMO | Admitting: Physician Assistant

## 2020-04-06 ENCOUNTER — Encounter: Payer: Self-pay | Admitting: Physician Assistant

## 2020-04-06 VITALS — BP 130/68 | HR 76 | Ht 59.0 in | Wt 138.0 lb

## 2020-04-06 DIAGNOSIS — I480 Paroxysmal atrial fibrillation: Secondary | ICD-10-CM

## 2020-04-06 DIAGNOSIS — I10 Essential (primary) hypertension: Secondary | ICD-10-CM | POA: Diagnosis not present

## 2020-04-06 DIAGNOSIS — R778 Other specified abnormalities of plasma proteins: Secondary | ICD-10-CM

## 2020-04-06 DIAGNOSIS — R011 Cardiac murmur, unspecified: Secondary | ICD-10-CM

## 2020-04-06 DIAGNOSIS — R7989 Other specified abnormal findings of blood chemistry: Secondary | ICD-10-CM

## 2020-04-06 MED ORDER — APIXABAN 5 MG PO TABS: 5.0000 mg | ORAL_TABLET | Freq: Two times a day (BID) | ORAL | 6 refills | Status: DC

## 2020-04-06 MED ORDER — METOPROLOL SUCCINATE ER 50 MG PO TB24: 50.0000 mg | ORAL_TABLET | Freq: Every day | ORAL | 3 refills | Status: DC

## 2020-04-06 NOTE — Progress Notes (Signed)
Cardiology Office Note:    Date:  04/06/2020   ID:  ODEAN MCELWAIN, DOB 08/16/1923, MRN [id number]  PCP:  Juluis Rainier, MD  [facility] Cardiologist:  Lance Muss, MD   [facility] Electrophysiologist:  None   Referring MD: Juluis Rainier, MD   Chief Complaint:  Follow-up (?Atrial Fibrillation)    Patient Profile:    Stephanie [name] is a 86 y.o. female with:   Atrial fibrillation   Notes indicate episode during stress test in remote past  Pt declined anticoagulation in the past; remote hx    Hypertension   Hyperlipidemia   Leg swelling   History of Present Illness:    Stephanie [name] was last seen by Dr. Eldridge Dace in 4/21.  She went to the emergency room 04/02/2020.  She was sent by EMS from her PCPs office.  Triage notes indicate she presented with symptoms of dizziness as well as exertional shortness of breath.  She left without being seen.  Of note, her high-sensitivity troponin was mildly elevated at 26.  She left before a repeat test was obtained.  She is seen today for further evaluation.  Of note, her chart indicates that she had an EKG with her PCP and that tracing is to be faxed to our office.         Data from the emergency room 04/02/2020 EKG: NSR, HR 72, PR 202, QTc 407, nonspecific ST-T wave changes K+ 4.6, Cr 0.79, Hgb 13.6, hs-Trop 26  She is here today with her friend.  She notes 2 episodes of rapid palpitations with associated near syncope last week.  The second episode went on for a couple of hours.  This prompted her to go to the walk-in clinic at [facility].  She notes that her symptoms had resolved by the time EMS was called to bring her to the hospital.  She felt well enough to leave before being seen by the provider.  She could tell her heart was fast.  She felt a little bit of shortness of breath with this.  She has not had chest pain, syncope.  She is not had recurrent episodes.  She has not had orthopnea.  She has chronic leg swelling.  It may be  somewhat worse recently.  Past Medical History:  Diagnosis Date  . Atrial fibrillation (HCC)   . HTN (hypertension)   . Hypercholesteremia   . Osteoporosis   . Pain in joint     Current Medications: Current Meds  Medication Sig  . alendronate (FOSAMAX) 70 MG tablet once a week.   Marland Kitchen amoxicillin (AMOXIL) 500 MG capsule For dental procedures  . aspirin 81 MG tablet Take 81 mg by mouth daily.   Marland Kitchen atorvastatin (LIPITOR) 10 MG tablet Take 5 mg by mouth. 1/2 tablet daily  . calcium-vitamin D 250-100 MG-UNIT per tablet Take 1 tablet by mouth daily.   . hydrochlorothiazide (HYDRODIURIL) 25 MG tablet Take 25 mg by mouth daily.  Marland Kitchen latanoprost (XALATAN) 0.005 % ophthalmic solution Place 1 drop into the left eye at bedtime.   . Multiple Vitamins-Minerals (CENTRUM SILVER PO) Take 1 tablet by mouth daily.   Marland Kitchen tolterodine (DETROL) 2 MG tablet Take 2 mg by mouth daily.  . [DISCONTINUED] metoprolol succinate (TOPROL-XL) 25 MG 24 hr tablet Take 25 mg by mouth daily.  . [DISCONTINUED] tolterodine (DETROL) 2 MG tablet Take 1 tablet (2 mg total) by mouth 3 (three) times a week. (Patient taking differently: Take 2 mg by mouth daily. )  Allergies:   Patient has no known allergies.   Social History   Tobacco Use  . Smoking status: Never Smoker  . Smokeless tobacco: Never Used  Vaping Use  . Vaping Use: Never used  Substance Use Topics  . Alcohol use: No    Alcohol/week: 0.0 standard drinks  . Drug use: No     Family Hx: The patient's Family history is unknown by patient.  Review of Systems  Constitutional: Negative for fever.  Respiratory: Negative for cough.   Gastrointestinal: Negative for diarrhea, hematochezia, melena and vomiting.  Genitourinary: Negative for hematuria.  All other systems reviewed and are negative.    EKGs/Labs/Other Test Reviewed:    EKG:  EKG is   ordered today.  The ekg ordered today demonstrates normal sinus rhythm, heart rate 76, normal axis, first-degree  AV block, PR interval 212 ms, septal Q waves, no ST-T wave changes, similar to prior tracings  EKG from primary care dated 04/02/2020 personally reviewed and interpreted: Transient atrial fibrillation converting to sinus rhythm, HR 93, normal axis, no ST-T wave changes  Recent Labs: 04/02/2020: BUN 23; Creatinine, Ser 0.79; Hemoglobin 13.6; Platelets 162; Potassium 4.6; Sodium 137   Recent Lipid Panel No results found for: CHOL, TRIG, HDL, CHOLHDL, LDLCALC, LDLDIRECT  Physical Exam:    VS:  BP 130/68   Pulse 76   Ht 4\' 11"  (1.499 m)   Wt 138 lb (62.6 kg)   SpO2 96%   BMI 27.87 kg/m     Wt Readings from Last 3 Encounters:  04/06/20 138 lb (62.6 kg)  10/27/19 138 lb 3.2 oz (62.7 kg)  10/24/18 149 lb (67.6 kg)     Constitutional:      Appearance: Healthy appearance. Not in distress.  Neck:     Thyroid: No thyromegaly.     Vascular: JVD normal.  Pulmonary:     Effort: Pulmonary effort is normal.     Breath sounds: No wheezing. No rales.  Cardiovascular:     Normal rate. Regular rhythm. Normal S1. Normal S2.     Murmurs: There is a grade 2/6 crescendo-decrescendo systolic murmur at the URSB.  Edema:    Ankle: bilateral 1+ edema of the ankle. Abdominal:     Palpations: Abdomen is soft. There is no hepatomegaly.  Skin:    General: Skin is warm and dry.  Neurological:     Mental Status: Alert and oriented to person, place and time.     Cranial Nerves: Cranial nerves are intact.       ASSESSMENT & PLAN:    1. Paroxysmal atrial fibrillation (HCC) She likely had 2 episodes of atrial fibrillation with rapid rate.  Her electrocardiogram at her primary care clinic did demonstrate atrial fibrillation.  She did convert to sinus rhythm on the same EKG.  She notes that her symptoms resolved by the time EMS was called and she went to the emergency room.  Therefore, it seems that her history is consistent with symptomatic atrial fibrillation with rapid rate.  CHA2DS2-VASc=4.  Her annual  stroke risk is 4.8%.  Therefore, she would benefit from long-term anticoagulation therapy.  I have recommended starting Apixaban.  She is agreeable to this.  Of note, her weight is 62.6 kg.  If this drops below 60 kg, she will need low-dose Apixaban.  I have also recommended adjusting her beta-blocker therapy.  If she has recurrent episodes despite this, we may need to consider antiarrhythmic drug therapy.  -Obtain TSH  -Stop aspirin  -Start  Apixaban 5 mg twice daily  -Increase metoprolol succinate to 50 mg daily  -Obtain echocardiogram  -Follow-up in 6 to 8 weeks with repeat CBC, BMET  2. Elevated troponin Her high-sensitivity troponin was just minimally elevated.  There was no repeat to get a delta.  I suspect, given her symptoms, that this was related to demand ischemia.  She had no chest pain and has not had symptoms to suggest angina since then.  She has no new changes on her ECG.  Therefore, we will proceed with an echocardiogram to start.  If she has wall motion abnormalities, we may need to consider stress testing for risk stratification.  3.  Systolic murmur This sounds like aortic stenosis.  Obtain echocardiogram as noted.  4. Essential hypertension Blood pressure is controlled.  I believe she should be able to tolerate the increase of metoprolol succinate.  Continue current dose of HCTZ.    Dispo:  Return in about 8 weeks (around 06/01/2020) for Routine Follow Up w/ Dr. Eldridge Dace , in person.   Medication Adjustments/Labs and Tests Ordered: Current medicines are reviewed at length with the patient today.  Concerns regarding medicines are outlined above.  Tests Ordered: Orders Placed This Encounter  Procedures  . TSH  . EKG 12-Lead  . ECHOCARDIOGRAM COMPLETE   Medication Changes: Meds ordered this encounter  Medications  . apixaban (ELIQUIS) 5 MG TABS tablet    Sig: Take 1 tablet (5 mg total) by mouth 2 (two) times daily.    Dispense:  60 tablet    Refill:  6  .  metoprolol succinate (TOPROL-XL) 50 MG 24 hr tablet    Sig: Take 1 tablet (50 mg total) by mouth daily. Take with or immediately following a meal.    Dispense:  90 tablet    Refill:  3    Signed, Tereso Newcomer, PA-C  04/06/2020 5:05 PM    [facility] [address] [employer], [place], [place]  [address] Phone: [phone]; Fax: [phone]

## 2020-04-06 NOTE — Patient Instructions (Addendum)
Medication Instructions:  Your physician has recommended you make the following change in your medication:   1) Start Eliquis 5 mg, 1 tablet by mouth twice a day 2) Increase Metoprolol to 50 mg, 1 tablet by mouth once a day  *If you need a refill on your cardiac medications before your next appointment, please call your pharmacy*  Lab Work: You will have labs drawn today: TSH  Testing/Procedures: Your physician has requested that you have an echocardiogram. Echocardiography is a painless test that uses sound waves to create images of your heart. It provides your doctor with information about the size and shape of your heart and how well your heart's chambers and valves are working. This procedure takes approximately one hour. There are no restrictions for this procedure.  Follow-Up: At [facility], you and your health needs are our priority.  As part of our continuing mission to provide you with exceptional heart care, we have created designated Provider Care Teams.  These Care Teams include your primary Cardiologist (physician) and Advanced Practice Providers (APPs -  Physician Assistants and Nurse Practitioners) who all work together to provide you with the care you need, when you need it.  Your next appointment:   6-8 week(s)  The format for your next appointment:   In Person  Provider:   Everette Rank, MD

## 2020-04-06 NOTE — Telephone Encounter (Signed)
Spoke with [employer] and they will fax over the patient's EKG from the walk-in clinic 10/01

## 2020-04-07 LAB — TSH: TSH: 3.77 u[IU]/mL (ref 0.450–4.500)

## 2020-04-08 NOTE — Telephone Encounter (Signed)
Patinet was seen by Tereso Newcomer, PA on 10/5 and reviewed EKG that was done at [facility] walk-in clinic on 10/1 showing Afib.

## 2020-04-27 DIAGNOSIS — E7800 Pure hypercholesterolemia, unspecified: Secondary | ICD-10-CM | POA: Diagnosis not present

## 2020-04-27 DIAGNOSIS — I4891 Unspecified atrial fibrillation: Secondary | ICD-10-CM | POA: Diagnosis not present

## 2020-04-27 DIAGNOSIS — M810 Age-related osteoporosis without current pathological fracture: Secondary | ICD-10-CM | POA: Diagnosis not present

## 2020-04-27 DIAGNOSIS — R7301 Impaired fasting glucose: Secondary | ICD-10-CM | POA: Diagnosis not present

## 2020-04-27 DIAGNOSIS — Z7901 Long term (current) use of anticoagulants: Secondary | ICD-10-CM | POA: Diagnosis not present

## 2020-04-27 DIAGNOSIS — I10 Essential (primary) hypertension: Secondary | ICD-10-CM | POA: Diagnosis not present

## 2020-04-28 ENCOUNTER — Ambulatory Visit (HOSPITAL_COMMUNITY): Payer: Medicare HMO | Attending: Cardiovascular Disease

## 2020-04-28 ENCOUNTER — Other Ambulatory Visit: Payer: Self-pay

## 2020-04-28 ENCOUNTER — Encounter: Payer: Self-pay | Admitting: Physician Assistant

## 2020-04-28 DIAGNOSIS — I480 Paroxysmal atrial fibrillation: Secondary | ICD-10-CM | POA: Diagnosis not present

## 2020-04-28 DIAGNOSIS — I350 Nonrheumatic aortic (valve) stenosis: Secondary | ICD-10-CM | POA: Insufficient documentation

## 2020-04-28 LAB — ECHOCARDIOGRAM COMPLETE
AR max vel: 1.42 cm2
AV Area VTI: 1.33 cm2
AV Area mean vel: 1.28 cm2
AV Mean grad: 22 mmHg
AV Peak grad: 36.2 mmHg
Ao pk vel: 3.01 m/s
Area-P 1/2: 3.03 cm2
P 1/2 time: 330 msec
S' Lateral: 1.9 cm

## 2020-06-01 DIAGNOSIS — M5442 Lumbago with sciatica, left side: Secondary | ICD-10-CM | POA: Diagnosis not present

## 2020-06-01 DIAGNOSIS — M9903 Segmental and somatic dysfunction of lumbar region: Secondary | ICD-10-CM | POA: Diagnosis not present

## 2020-06-02 DIAGNOSIS — R7989 Other specified abnormal findings of blood chemistry: Secondary | ICD-10-CM | POA: Diagnosis not present

## 2020-06-07 DIAGNOSIS — M5442 Lumbago with sciatica, left side: Secondary | ICD-10-CM | POA: Diagnosis not present

## 2020-06-07 DIAGNOSIS — M9903 Segmental and somatic dysfunction of lumbar region: Secondary | ICD-10-CM | POA: Diagnosis not present

## 2020-06-15 DIAGNOSIS — M5442 Lumbago with sciatica, left side: Secondary | ICD-10-CM | POA: Diagnosis not present

## 2020-06-15 DIAGNOSIS — M9903 Segmental and somatic dysfunction of lumbar region: Secondary | ICD-10-CM | POA: Diagnosis not present

## 2020-06-15 NOTE — Progress Notes (Signed)
Cardiology Office Note   Date:  06/16/2020   ID:  Stephanie [name], DOB December 30, 1923, MRN [id number]  PCP:  Leighton Ruff, MD    No chief complaint on file.  AFib  Wt Readings from Last 3 Encounters:  06/16/20 138 lb 9.6 oz (62.9 kg)  04/06/20 138 lb (62.6 kg)  10/27/19 138 lb 3.2 oz (62.7 kg)       History of Present Illness: Stephanie [name] is a 86 y.o. female  with hypertension, atrial fibrillation and high cholesterol.  She has 8 children, 4 girls and 4 boys.  Closest child is in [place].  In the past, she had concerns about incontinence.  Oxybutynin was suggested and she was instructed to watch for symptoms of lightheadedness and orthostatic hypotension.  In the past, she was staying at her son's house.  She went back home.  She has had both COVID vaccines.  Has had leg edema: "Compression Stockings have been difficult for her.  SHe has been hesitant about using Lasix in the past due to increased urination."  She had an episode of AFib on 10/1: "she was playing cards and got very lightheaded and felt like she was going to pass out. Denies syncope, chest pain, SOB or any other Sx. She was taken to the [facility] clinic by her friend. She states that they did an EKG that showed possible Afib 93 bpm. She was sent to the ER for further evaluation. EKG in ER was NSR. She left before being seen by a provider. She states that she has been completely asymptomatic since the event. BP 135/66 HR 70. "  No AFib since that time.   In 04/07/20, she saw [place].  Eliquis was started.  Metoprolol was increased.  No bleeding problems.   10/21 echo showed: "Left ventricular ejection fraction, by estimation, is 60 to 65%. The  left ventricle has normal function. The left ventricle has no regional  wall motion abnormalities. Left ventricular diastolic parameters are  consistent with Grade II diastolic  dysfunction (pseudonormalization).  2. Right ventricular  systolic function is normal. The right ventricular  size is normal. There is normal pulmonary artery systolic pressure.  3. The mitral valve is degenerative. Mild mitral valve regurgitation. No  evidence of mitral stenosis. Severe mitral annular calcification.  4. Functionally bicuspid with fused right and left cusp . The aortic  valve is calcified. There is moderate calcification of the aortic valve.  There is moderate thickening of the aortic valve. Aortic valve  regurgitation is mild. Moderate aortic valve  stenosis.  5. The inferior vena cava is normal in size with greater than 50%  respiratory variability, suggesting right atrial pressure of 3 mmHg. "   Past Medical History:  Diagnosis Date  . Aortic stenosis    Echo 10/21: EF 60-65, no RWMA, GR 2 DD, normal RVSF, mild MR, severe MAC, aortic valve moderately calcified, functionally bicuspid with mild AI, moderate AS (mean gradient 22 mmHg, V-max 301 cm/s, DI 0.32)  . Atrial fibrillation ([place])   . HTN (hypertension)   . Hypercholesteremia   . Osteoporosis   . Pain in joint     History reviewed. No pertinent surgical history.   Current Outpatient Medications  Medication Sig Dispense Refill  . alendronate (FOSAMAX) 70 MG tablet once a week.     Marland Kitchen amoxicillin (AMOXIL) 500 MG capsule For dental procedures    . apixaban (ELIQUIS) 5 MG TABS tablet Take 1 tablet (5  mg total) by mouth 2 (two) times daily. 60 tablet 6  . atorvastatin (LIPITOR) 10 MG tablet Take 5 mg by mouth. 1/2 tablet daily    . calcium-vitamin D 250-100 MG-UNIT per tablet Take 1 tablet by mouth daily.     . hydrochlorothiazide (HYDRODIURIL) 25 MG tablet Take 25 mg by mouth daily.    Marland Kitchen latanoprost (XALATAN) 0.005 % ophthalmic solution Place 1 drop into the left eye at bedtime.     . metoprolol succinate (TOPROL-XL) 50 MG 24 hr tablet Take 1 tablet (50 mg total) by mouth daily. Take with or immediately following a meal. 90 tablet 3  . Multiple Vitamins-Minerals  (CENTRUM SILVER PO) Take 1 tablet by mouth daily.     Marland Kitchen tolterodine (DETROL) 2 MG tablet Take 2 mg by mouth daily.     No current facility-administered medications for this visit.    Allergies:   Patient has no known allergies.    Social History:  The patient  reports that she has never smoked. She has never used smokeless tobacco. She reports that she does not drink alcohol and does not use drugs.   Family History:  The patient's Family history is unknown by patient.    ROS:  Please see the history of present illness.   Otherwise, review of systems are positive for leg swelling.   All other systems are reviewed and negative.    PHYSICAL EXAM: VS:  BP 128/68   Pulse 86   Ht _0  (1.499 m)   Wt 138 lb 9.6 oz (62.9 kg)   SpO2 97%   BMI 27.99 kg/m  , BMI Body mass index is 27.99 kg/m. GEN: Well nourished, well developed, in no acute distress  HEENT: normal  Neck: no JVD, carotid bruits, or masses Cardiac: RRR; no murmurs, rubs, or gallops,no edema  Respiratory:  clear to auscultation bilaterally, normal work of breathing GI: soft, nontender, nondistended, + BS MS: no deformity or atrophy  Skin: warm and dry, no rash Neuro:  Strength and sensation are intact Psych: euthymic mood, full affect   EKG:   The ekg ordered 10/1 demonstrates NSR   Recent Labs: 04/02/2020: BUN 23; Creatinine, Ser 0.79; Hemoglobin 13.6; Platelets 162; Potassium 4.6; Sodium 137 04/06/2020: TSH 3.770   Lipid Panel No results found for: CHOL, TRIG, HDL, CHOLHDL, VLDL, LDLCALC, LDLDIRECT   Other studies Reviewed: Additional studies/ records that were reviewed today with results demonstrating: ER records reviewed.   ASSESSMENT AND PLAN:  1. Atrial fibrillation: In NSR. Eliquis for stroke prevention.  Close to needing only half dose based on weight and age.  Would have a low threshold to decrease dose if there were any issues.  2. HTN: The current medical regimen is effective;  continue present  plan and medications. 3. Hyperlipidemia: LDL 87.  Continue atorvastatin.  4. LE edema: Leg elevation. Compression Stockings have been difficult for her.  SHe has been hesitant about using Lasix in the past due to increased urination.    Current medicines are reviewed at length with the patient today.  The patient concerns regarding her medicines were addressed.  The following changes have been made:  No change  Labs/ tests ordered today include:  No orders of the defined types were placed in this encounter.   Recommend 150 minutes/week of aerobic exercise Low fat, low carb, high fiber diet recommended  Disposition:   FU in 6 months   Signed, Larae Grooms, MD  06/16/2020 3:06 PM  [facility] [place], [place], [place]  [address] Phone: [phone]; Fax: [phone]

## 2020-06-16 ENCOUNTER — Other Ambulatory Visit: Payer: Self-pay

## 2020-06-16 ENCOUNTER — Encounter: Payer: Self-pay | Admitting: Interventional Cardiology

## 2020-06-16 ENCOUNTER — Ambulatory Visit: Payer: Medicare HMO | Admitting: Interventional Cardiology

## 2020-06-16 VITALS — BP 128/68 | HR 86 | Ht 59.0 in | Wt 138.6 lb

## 2020-06-16 DIAGNOSIS — I480 Paroxysmal atrial fibrillation: Secondary | ICD-10-CM | POA: Diagnosis not present

## 2020-06-16 DIAGNOSIS — I10 Essential (primary) hypertension: Secondary | ICD-10-CM | POA: Diagnosis not present

## 2020-06-16 DIAGNOSIS — M25472 Effusion, left ankle: Secondary | ICD-10-CM

## 2020-06-16 DIAGNOSIS — M25471 Effusion, right ankle: Secondary | ICD-10-CM | POA: Diagnosis not present

## 2020-06-16 DIAGNOSIS — E7800 Pure hypercholesterolemia, unspecified: Secondary | ICD-10-CM

## 2020-06-16 DIAGNOSIS — R011 Cardiac murmur, unspecified: Secondary | ICD-10-CM | POA: Diagnosis not present

## 2020-06-16 NOTE — Patient Instructions (Signed)

## 2020-06-18 DIAGNOSIS — E7800 Pure hypercholesterolemia, unspecified: Secondary | ICD-10-CM | POA: Diagnosis not present

## 2020-06-18 DIAGNOSIS — Z961 Presence of intraocular lens: Secondary | ICD-10-CM | POA: Diagnosis not present

## 2020-06-18 DIAGNOSIS — I4891 Unspecified atrial fibrillation: Secondary | ICD-10-CM | POA: Diagnosis not present

## 2020-06-18 DIAGNOSIS — I10 Essential (primary) hypertension: Secondary | ICD-10-CM | POA: Diagnosis not present

## 2020-06-18 DIAGNOSIS — M810 Age-related osteoporosis without current pathological fracture: Secondary | ICD-10-CM | POA: Diagnosis not present

## 2020-08-30 DIAGNOSIS — I4891 Unspecified atrial fibrillation: Secondary | ICD-10-CM | POA: Diagnosis not present

## 2020-08-30 DIAGNOSIS — I10 Essential (primary) hypertension: Secondary | ICD-10-CM | POA: Diagnosis not present

## 2020-08-30 DIAGNOSIS — M419 Scoliosis, unspecified: Secondary | ICD-10-CM | POA: Diagnosis not present

## 2020-08-30 DIAGNOSIS — R269 Unspecified abnormalities of gait and mobility: Secondary | ICD-10-CM | POA: Diagnosis not present

## 2020-08-30 DIAGNOSIS — R32 Unspecified urinary incontinence: Secondary | ICD-10-CM | POA: Diagnosis not present

## 2020-08-30 DIAGNOSIS — M810 Age-related osteoporosis without current pathological fracture: Secondary | ICD-10-CM | POA: Diagnosis not present

## 2020-08-30 DIAGNOSIS — E7800 Pure hypercholesterolemia, unspecified: Secondary | ICD-10-CM | POA: Diagnosis not present

## 2020-08-30 DIAGNOSIS — Z9181 History of falling: Secondary | ICD-10-CM | POA: Diagnosis not present

## 2020-09-09 DIAGNOSIS — M545 Low back pain, unspecified: Secondary | ICD-10-CM | POA: Diagnosis not present

## 2020-09-09 DIAGNOSIS — R262 Difficulty in walking, not elsewhere classified: Secondary | ICD-10-CM | POA: Diagnosis not present

## 2020-09-14 DIAGNOSIS — M545 Low back pain, unspecified: Secondary | ICD-10-CM | POA: Diagnosis not present

## 2020-09-14 DIAGNOSIS — R262 Difficulty in walking, not elsewhere classified: Secondary | ICD-10-CM | POA: Diagnosis not present

## 2020-09-16 DIAGNOSIS — M810 Age-related osteoporosis without current pathological fracture: Secondary | ICD-10-CM | POA: Diagnosis not present

## 2020-09-16 DIAGNOSIS — M545 Low back pain, unspecified: Secondary | ICD-10-CM | POA: Diagnosis not present

## 2020-09-16 DIAGNOSIS — I4891 Unspecified atrial fibrillation: Secondary | ICD-10-CM | POA: Diagnosis not present

## 2020-09-16 DIAGNOSIS — I10 Essential (primary) hypertension: Secondary | ICD-10-CM | POA: Diagnosis not present

## 2020-09-16 DIAGNOSIS — E7800 Pure hypercholesterolemia, unspecified: Secondary | ICD-10-CM | POA: Diagnosis not present

## 2020-09-16 DIAGNOSIS — R262 Difficulty in walking, not elsewhere classified: Secondary | ICD-10-CM | POA: Diagnosis not present

## 2020-09-16 DIAGNOSIS — Z961 Presence of intraocular lens: Secondary | ICD-10-CM | POA: Diagnosis not present

## 2020-09-21 DIAGNOSIS — R262 Difficulty in walking, not elsewhere classified: Secondary | ICD-10-CM | POA: Diagnosis not present

## 2020-09-21 DIAGNOSIS — M545 Low back pain, unspecified: Secondary | ICD-10-CM | POA: Diagnosis not present

## 2020-09-22 DIAGNOSIS — H349 Unspecified retinal vascular occlusion: Secondary | ICD-10-CM | POA: Diagnosis not present

## 2020-09-22 DIAGNOSIS — H401122 Primary open-angle glaucoma, left eye, moderate stage: Secondary | ICD-10-CM | POA: Diagnosis not present

## 2020-09-23 DIAGNOSIS — M545 Low back pain, unspecified: Secondary | ICD-10-CM | POA: Diagnosis not present

## 2020-09-23 DIAGNOSIS — R262 Difficulty in walking, not elsewhere classified: Secondary | ICD-10-CM | POA: Diagnosis not present

## 2020-09-24 ENCOUNTER — Other Ambulatory Visit: Payer: Self-pay | Admitting: Family Medicine

## 2020-09-24 DIAGNOSIS — M810 Age-related osteoporosis without current pathological fracture: Secondary | ICD-10-CM

## 2020-09-28 DIAGNOSIS — M545 Low back pain, unspecified: Secondary | ICD-10-CM | POA: Diagnosis not present

## 2020-09-28 DIAGNOSIS — R262 Difficulty in walking, not elsewhere classified: Secondary | ICD-10-CM | POA: Diagnosis not present

## 2020-09-30 DIAGNOSIS — R262 Difficulty in walking, not elsewhere classified: Secondary | ICD-10-CM | POA: Diagnosis not present

## 2020-09-30 DIAGNOSIS — M545 Low back pain, unspecified: Secondary | ICD-10-CM | POA: Diagnosis not present

## 2020-10-05 DIAGNOSIS — R262 Difficulty in walking, not elsewhere classified: Secondary | ICD-10-CM | POA: Diagnosis not present

## 2020-10-05 DIAGNOSIS — M545 Low back pain, unspecified: Secondary | ICD-10-CM | POA: Diagnosis not present

## 2020-10-07 DIAGNOSIS — R262 Difficulty in walking, not elsewhere classified: Secondary | ICD-10-CM | POA: Diagnosis not present

## 2020-10-07 DIAGNOSIS — M545 Low back pain, unspecified: Secondary | ICD-10-CM | POA: Diagnosis not present

## 2020-10-12 DIAGNOSIS — R262 Difficulty in walking, not elsewhere classified: Secondary | ICD-10-CM | POA: Diagnosis not present

## 2020-10-12 DIAGNOSIS — M545 Low back pain, unspecified: Secondary | ICD-10-CM | POA: Diagnosis not present

## 2020-10-21 DIAGNOSIS — R262 Difficulty in walking, not elsewhere classified: Secondary | ICD-10-CM | POA: Diagnosis not present

## 2020-10-21 DIAGNOSIS — M545 Low back pain, unspecified: Secondary | ICD-10-CM | POA: Diagnosis not present

## 2020-11-09 NOTE — Progress Notes (Signed)
Cardiology Office Note   Date:  11/10/2020   ID:  Stephanie [name], DOB July 28, 1923, MRN [id number]  PCP:  Aretta Nip, MD    No chief complaint on file.  AFib  Wt Readings from Last 3 Encounters:  11/10/20 142 lb 3.2 oz (64.5 kg)  06/16/20 138 lb 9.6 oz (62.9 kg)  04/06/20 138 lb (62.6 kg)       History of Present Illness: Stephanie [name] is a 87 y.o. female  with hypertension, atrial fibrillation and high cholesterol.  She has 8 children, 4 girls and 4 boys. Closest child is in [place].  In the past, she had concerns about incontinence. Oxybutynin was suggested and she was instructed to watch for symptoms of lightheadedness and orthostatic hypotension.  In the past, she was staying at her son's house.  She went back home. She has had both COVID vaccines.  Has had leg edema: "Compression Stockings have been difficult for her.  SHe has been hesitant about using Lasix in the past due to increased urination."  She had an episode of AFib on 04/02/2020: "she was playing cards and got very lightheaded and felt like she was going to pass out. Denies syncope, chest pain, SOB or any other Sx. She was taken to the [facility] clinic by her friend. She states that they did an EKG that showed possible Afib 93 bpm. She was sent to the ER for further evaluation. EKG in ER was NSR. She left before being seen by a provider. She states that she has been completely asymptomatic since the event. BP 135/66 HR 70. "  No AFib since that time.   In 04/07/20, she saw [place].  Eliquis was started.  Metoprolol was increased.  No bleeding problems.   10/21 echo showed: "Left ventricular ejection fraction, by estimation, is 60 to 65%. The  left ventricle has normal function. The left ventricle has no regional  wall motion abnormalities. Left ventricular diastolic parameters are  consistent with Grade II diastolic  dysfunction (pseudonormalization).  2. Right  ventricular systolic function is normal. The right ventricular  size is normal. There is normal pulmonary artery systolic pressure.  3. The mitral valve is degenerative. Mild mitral valve regurgitation. No  evidence of mitral stenosis. Severe mitral annular calcification.  4. Functionally bicuspid with fused right and left cusp . The aortic  valve is calcified. There is moderate calcification of the aortic valve.  There is moderate thickening of the aortic valve. Aortic valve  regurgitation is mild. Moderate aortic valve  stenosis.  5. The inferior vena cava is normal in size with greater than 50%  respiratory variability, suggesting right atrial pressure of 3 mmHg. "  Noted in 2021, in NSR. Eliquis for stroke prevention.  Close to needing only half dose based on weight and age.  Would have a low threshold to decrease dose if there were any issues.   Did PT ands was walking more.  She felt better.  This has decreased since PT finished.   No bleeding issues.   Denies : Chest pain. Dizziness. Nitroglycerin use. Orthopnea. Palpitations. Paroxysmal nocturnal dyspnea. Shortness of breath. Syncope.   Still has some ankle edema.    Past Medical History:  Diagnosis Date  . Aortic stenosis    Echo 10/21: EF 60-65, no RWMA, GR 2 DD, normal RVSF, mild MR, severe MAC, aortic valve moderately calcified, functionally bicuspid with mild AI, moderate AS (mean gradient 22 mmHg, V-max 301  cm/s, DI 0.32)  . Atrial fibrillation ([place])   . HTN (hypertension)   . Hypercholesteremia   . Osteoporosis   . Pain in joint     No past surgical history on file.   Current Outpatient Medications  Medication Sig Dispense Refill  . alendronate (FOSAMAX) 70 MG tablet once a week.     Marland Kitchen amoxicillin (AMOXIL) 500 MG capsule For dental procedures    . apixaban (ELIQUIS) 5 MG TABS tablet Take 1 tablet (5 mg total) by mouth 2 (two) times daily. 60 tablet 6  . atorvastatin (LIPITOR) 10 MG tablet Take 5 mg by  mouth. 1/2 tablet daily    . calcium-vitamin D 250-100 MG-UNIT per tablet Take 1 tablet by mouth daily.     . hydrochlorothiazide (HYDRODIURIL) 25 MG tablet Take 25 mg by mouth daily.    Marland Kitchen latanoprost (XALATAN) 0.005 % ophthalmic solution Place 1 drop into the left eye at bedtime.     . metoprolol succinate (TOPROL-XL) 50 MG 24 hr tablet Take 1 tablet (50 mg total) by mouth daily. Take with or immediately following a meal. 90 tablet 3  . Multiple Vitamins-Minerals (CENTRUM SILVER PO) Take 1 tablet by mouth daily.     Marland Kitchen tolterodine (DETROL) 2 MG tablet Take 2 mg by mouth daily.     No current facility-administered medications for this visit.    Allergies:   Patient has no known allergies.    Social History:  The patient  reports that she has never smoked. She has never used smokeless tobacco. She reports that she does not drink alcohol and does not use drugs.   Family History:  The patient's *Family history is unknown by patient.    ROS:  Please see the history of present illness.   Otherwise, review of systems are positive for slowing down in general.   All other systems are reviewed and negative.    PHYSICAL EXAM: VS:  BP (!) 144/84   Pulse 73   Ht _0  (1.499 m)   Wt 142 lb 3.2 oz (64.5 kg)   SpO2 93%   BMI 28.72 kg/m  , BMI Body mass index is 28.72 kg/m. GEN: Well nourished, well developed, in no acute distress  HEENT: normal  Neck: no JVD, carotid bruits, or masses Cardiac: RRR; no murmurs, rubs, or gallops,no edema  Respiratory:  clear to auscultation bilaterally, normal work of breathing GI: soft, nontender, nondistended, + BS MS: no deformity or atrophy  Skin: warm and dry, no rash Neuro:  Strength and sensation are intact Psych: euthymic mood, full affect   EKG:   The ekg ordered in 10/21 demonstrates NSR   Recent Labs: 04/02/2020: BUN 23; Creatinine, Ser 0.79; Hemoglobin 13.6; Platelets 162; Potassium 4.6; Sodium 137 04/06/2020: TSH 3.770   Lipid Panel No  results found for: CHOL, TRIG, HDL, CHOLHDL, VLDL, LDLCALC, LDLDIRECT   Other studies Reviewed: Additional studies/ records that were reviewed today with results demonstrating: normal Cr and Hbg in 2021.   ASSESSMENT AND PLAN:  1. AFib: No palpitations. No bleeding issues. Will check on dosing of Eliquis. Check BMet and CBC.  2. HTN: The current medical regimen is effective;  continue present plan and medications. 3. Hyperlipidemia: The current medical regimen is effective;  continue present plan and medications. 4. Leg edema: Elevate legs.   5. Systolic murmur: Moderate aortic stenosis.  Functional bicuspid valve noted in 2021. No sx of severe AS.  If she has sx, would repeat an echo.  Current medicines are reviewed at length with the patient today.  The patient concerns regarding her medicines were addressed.  The following changes have been made:  No change  Labs/ tests ordered today include:  No orders of the defined types were placed in this encounter.   Recommend 150 minutes/week of aerobic exercise Low fat, low carb, high fiber diet recommended  Disposition:   FU in 6 months   Signed, Larae Grooms, MD  11/10/2020 10:28 AM    [facility] [place], [place], [place]  [address] Phone: [phone]; Fax: [phone]

## 2020-11-10 ENCOUNTER — Other Ambulatory Visit: Payer: Self-pay

## 2020-11-10 ENCOUNTER — Encounter: Payer: Self-pay | Admitting: Interventional Cardiology

## 2020-11-10 ENCOUNTER — Ambulatory Visit: Payer: Medicare HMO | Admitting: Interventional Cardiology

## 2020-11-10 VITALS — BP 144/84 | HR 73 | Ht 59.0 in | Wt 142.2 lb

## 2020-11-10 DIAGNOSIS — E7800 Pure hypercholesterolemia, unspecified: Secondary | ICD-10-CM | POA: Diagnosis not present

## 2020-11-10 DIAGNOSIS — I480 Paroxysmal atrial fibrillation: Secondary | ICD-10-CM

## 2020-11-10 DIAGNOSIS — I10 Essential (primary) hypertension: Secondary | ICD-10-CM | POA: Diagnosis not present

## 2020-11-10 DIAGNOSIS — M25472 Effusion, left ankle: Secondary | ICD-10-CM | POA: Diagnosis not present

## 2020-11-10 DIAGNOSIS — M25471 Effusion, right ankle: Secondary | ICD-10-CM

## 2020-11-10 DIAGNOSIS — R011 Cardiac murmur, unspecified: Secondary | ICD-10-CM

## 2020-11-10 LAB — CBC
Hematocrit: 40.7 % (ref 34.0–46.6)
Hemoglobin: 13.8 g/dL (ref 11.1–15.9)
MCH: 32.2 pg (ref 26.6–33.0)
MCHC: 33.9 g/dL (ref 31.5–35.7)
MCV: 95 fL (ref 79–97)
Platelets: 178 10*3/uL (ref 150–450)
RBC: 4.28 x10E6/uL (ref 3.77–5.28)
RDW: 11.6 % — ABNORMAL LOW (ref 11.7–15.4)
WBC: 7.7 10*3/uL (ref 3.4–10.8)

## 2020-11-10 LAB — BASIC METABOLIC PANEL
BUN/Creatinine Ratio: 27 (ref 12–28)
BUN: 26 mg/dL (ref 10–36)
CO2: 28 mmol/L (ref 20–29)
Calcium: 9.9 mg/dL (ref 8.7–10.3)
Chloride: 99 mmol/L (ref 96–106)
Creatinine, Ser: 0.95 mg/dL (ref 0.57–1.00)
Glucose: 101 mg/dL — ABNORMAL HIGH (ref 65–99)
Potassium: 4.7 mmol/L (ref 3.5–5.2)
Sodium: 141 mmol/L (ref 134–144)
eGFR: 56 mL/min/{1.73_m2} — ABNORMAL LOW (ref >59)

## 2020-11-10 NOTE — Patient Instructions (Signed)
Medication Instructions:  Your physician recommends that you continue on your current medications as directed. Please refer to the Current Medication list given to you today.  *If you need a refill on your cardiac medications before your next appointment, please call your pharmacy*   Lab Work: Lab work to be done today--BMP and CBC If you have labs (blood work) drawn today and your tests are completely normal, you will receive your results only by: Marland Kitchen MyChart Message (if you have MyChart) OR . A paper copy in the mail If you have any lab test that is abnormal or we need to change your treatment, we will call you to review the results.   Testing/Procedures: none   Follow-Up: At [facility], you and your health needs are our priority.  As part of our continuing mission to provide you with exceptional heart care, we have created designated Provider Care Teams.  These Care Teams include your primary Cardiologist (physician) and Advanced Practice Providers (APPs -  Physician Assistants and Nurse Practitioners) who all work together to provide you with the care you need, when you need it.  We recommend signing up for the patient portal called "MyChart".  Sign up information is provided on this After Visit Summary.  MyChart is used to connect with patients for Virtual Visits (Telemedicine).  Patients are able to view lab/test results, encounter notes, upcoming appointments, etc.  Non-urgent messages can be sent to your provider as well.   To learn more about what you can do with MyChart, go to [email or web address].    Your next appointment:   6 month(s)  The format for your next appointment:   In Person  Provider:   You may see Lance Muss, MD or one of the following Advanced Practice Providers on your designated Care Team:    Ronie Spies, PA-C  Jacolyn Reedy, PA-C    Other Instructions

## 2020-11-11 ENCOUNTER — Other Ambulatory Visit: Payer: Self-pay | Admitting: Pharmacist

## 2020-11-11 MED ORDER — ELIQUIS 5 MG PO TABS: 5.0000 mg | ORAL_TABLET | Freq: Two times a day (BID) | ORAL | 6 refills | Status: DC

## 2020-11-25 DIAGNOSIS — M810 Age-related osteoporosis without current pathological fracture: Secondary | ICD-10-CM | POA: Diagnosis not present

## 2020-11-25 DIAGNOSIS — I10 Essential (primary) hypertension: Secondary | ICD-10-CM | POA: Diagnosis not present

## 2020-11-25 DIAGNOSIS — I4891 Unspecified atrial fibrillation: Secondary | ICD-10-CM | POA: Diagnosis not present

## 2020-11-25 DIAGNOSIS — Z961 Presence of intraocular lens: Secondary | ICD-10-CM | POA: Diagnosis not present

## 2020-11-25 DIAGNOSIS — E7800 Pure hypercholesterolemia, unspecified: Secondary | ICD-10-CM | POA: Diagnosis not present

## 2021-01-29 DIAGNOSIS — S63111A Subluxation of metacarpophalangeal joint of right thumb, initial encounter: Secondary | ICD-10-CM | POA: Diagnosis not present

## 2021-01-29 DIAGNOSIS — W101XXA Fall (on)(from) sidewalk curb, initial encounter: Secondary | ICD-10-CM | POA: Diagnosis not present

## 2021-01-29 DIAGNOSIS — S6991XA Unspecified injury of right wrist, hand and finger(s), initial encounter: Secondary | ICD-10-CM | POA: Diagnosis not present

## 2021-01-31 DIAGNOSIS — S138XXA Sprain of joints and ligaments of other parts of neck, initial encounter: Secondary | ICD-10-CM | POA: Diagnosis not present

## 2021-01-31 DIAGNOSIS — M9901 Segmental and somatic dysfunction of cervical region: Secondary | ICD-10-CM | POA: Diagnosis not present

## 2021-01-31 DIAGNOSIS — M79641 Pain in right hand: Secondary | ICD-10-CM | POA: Diagnosis not present

## 2021-01-31 DIAGNOSIS — S63641A Sprain of metacarpophalangeal joint of right thumb, initial encounter: Secondary | ICD-10-CM | POA: Diagnosis not present

## 2021-02-01 DIAGNOSIS — S138XXA Sprain of joints and ligaments of other parts of neck, initial encounter: Secondary | ICD-10-CM | POA: Diagnosis not present

## 2021-02-01 DIAGNOSIS — M9901 Segmental and somatic dysfunction of cervical region: Secondary | ICD-10-CM | POA: Diagnosis not present

## 2021-02-02 DIAGNOSIS — S138XXA Sprain of joints and ligaments of other parts of neck, initial encounter: Secondary | ICD-10-CM | POA: Diagnosis not present

## 2021-02-02 DIAGNOSIS — M9901 Segmental and somatic dysfunction of cervical region: Secondary | ICD-10-CM | POA: Diagnosis not present

## 2021-02-07 DIAGNOSIS — M9901 Segmental and somatic dysfunction of cervical region: Secondary | ICD-10-CM | POA: Diagnosis not present

## 2021-02-07 DIAGNOSIS — S138XXA Sprain of joints and ligaments of other parts of neck, initial encounter: Secondary | ICD-10-CM | POA: Diagnosis not present

## 2021-02-07 NOTE — Telephone Encounter (Signed)
I spoke with patient.  She weighed 138 lbs without shoes on this morning

## 2021-02-08 DIAGNOSIS — M9901 Segmental and somatic dysfunction of cervical region: Secondary | ICD-10-CM | POA: Diagnosis not present

## 2021-02-08 DIAGNOSIS — Z0100 Encounter for examination of eyes and vision without abnormal findings: Secondary | ICD-10-CM | POA: Diagnosis not present

## 2021-02-08 DIAGNOSIS — S138XXA Sprain of joints and ligaments of other parts of neck, initial encounter: Secondary | ICD-10-CM | POA: Diagnosis not present

## 2021-02-10 MED ORDER — APIXABAN 2.5 MG PO TABS: 2.5000 mg | ORAL_TABLET | Freq: Two times a day (BID) | ORAL | 6 refills | Status: DC

## 2021-02-10 NOTE — Telephone Encounter (Signed)
I would stick to the Eliquis 2.5 mg BID dosing going forward.   JV   Patient notified.  Prescription sent to [facility]

## 2021-02-14 DIAGNOSIS — S138XXA Sprain of joints and ligaments of other parts of neck, initial encounter: Secondary | ICD-10-CM | POA: Diagnosis not present

## 2021-02-14 DIAGNOSIS — M9901 Segmental and somatic dysfunction of cervical region: Secondary | ICD-10-CM | POA: Diagnosis not present

## 2021-02-15 DIAGNOSIS — E7800 Pure hypercholesterolemia, unspecified: Secondary | ICD-10-CM | POA: Diagnosis not present

## 2021-02-15 DIAGNOSIS — R7301 Impaired fasting glucose: Secondary | ICD-10-CM | POA: Diagnosis not present

## 2021-02-15 DIAGNOSIS — M810 Age-related osteoporosis without current pathological fracture: Secondary | ICD-10-CM | POA: Diagnosis not present

## 2021-02-15 DIAGNOSIS — R799 Abnormal finding of blood chemistry, unspecified: Secondary | ICD-10-CM | POA: Diagnosis not present

## 2021-02-15 DIAGNOSIS — I10 Essential (primary) hypertension: Secondary | ICD-10-CM | POA: Diagnosis not present

## 2021-02-16 DIAGNOSIS — S138XXA Sprain of joints and ligaments of other parts of neck, initial encounter: Secondary | ICD-10-CM | POA: Diagnosis not present

## 2021-02-16 DIAGNOSIS — M9901 Segmental and somatic dysfunction of cervical region: Secondary | ICD-10-CM | POA: Diagnosis not present

## 2021-02-17 DIAGNOSIS — S63641A Sprain of metacarpophalangeal joint of right thumb, initial encounter: Secondary | ICD-10-CM | POA: Diagnosis not present

## 2021-02-18 DIAGNOSIS — Z1389 Encounter for screening for other disorder: Secondary | ICD-10-CM | POA: Diagnosis not present

## 2021-02-18 DIAGNOSIS — Z0000 Encounter for general adult medical examination without abnormal findings: Secondary | ICD-10-CM | POA: Diagnosis not present

## 2021-02-21 DIAGNOSIS — M9901 Segmental and somatic dysfunction of cervical region: Secondary | ICD-10-CM | POA: Diagnosis not present

## 2021-02-21 DIAGNOSIS — S138XXA Sprain of joints and ligaments of other parts of neck, initial encounter: Secondary | ICD-10-CM | POA: Diagnosis not present

## 2021-02-28 DIAGNOSIS — M9901 Segmental and somatic dysfunction of cervical region: Secondary | ICD-10-CM | POA: Diagnosis not present

## 2021-02-28 DIAGNOSIS — S138XXA Sprain of joints and ligaments of other parts of neck, initial encounter: Secondary | ICD-10-CM | POA: Diagnosis not present

## 2021-03-15 DIAGNOSIS — M9901 Segmental and somatic dysfunction of cervical region: Secondary | ICD-10-CM | POA: Diagnosis not present

## 2021-03-15 DIAGNOSIS — S138XXA Sprain of joints and ligaments of other parts of neck, initial encounter: Secondary | ICD-10-CM | POA: Diagnosis not present

## 2021-03-23 DIAGNOSIS — I10 Essential (primary) hypertension: Secondary | ICD-10-CM | POA: Diagnosis not present

## 2021-03-23 DIAGNOSIS — I4891 Unspecified atrial fibrillation: Secondary | ICD-10-CM | POA: Diagnosis not present

## 2021-03-23 DIAGNOSIS — E7800 Pure hypercholesterolemia, unspecified: Secondary | ICD-10-CM | POA: Diagnosis not present

## 2021-03-23 DIAGNOSIS — M810 Age-related osteoporosis without current pathological fracture: Secondary | ICD-10-CM | POA: Diagnosis not present

## 2021-03-23 DIAGNOSIS — Z961 Presence of intraocular lens: Secondary | ICD-10-CM | POA: Diagnosis not present

## 2021-03-29 DIAGNOSIS — S138XXA Sprain of joints and ligaments of other parts of neck, initial encounter: Secondary | ICD-10-CM | POA: Diagnosis not present

## 2021-03-29 DIAGNOSIS — M9901 Segmental and somatic dysfunction of cervical region: Secondary | ICD-10-CM | POA: Diagnosis not present

## 2021-03-30 ENCOUNTER — Ambulatory Visit
Admission: RE | Admit: 2021-03-30 | Discharge: 2021-03-30 | Disposition: A | Discharge status: Home / Self Care | Payer: Medicare HMO | Source: Ambulatory Visit | Attending: Family Medicine | Admitting: Family Medicine

## 2021-03-30 ENCOUNTER — Other Ambulatory Visit: Payer: Self-pay

## 2021-03-30 DIAGNOSIS — Z780 Asymptomatic menopausal state: Secondary | ICD-10-CM | POA: Diagnosis not present

## 2021-03-30 DIAGNOSIS — M810 Age-related osteoporosis without current pathological fracture: Secondary | ICD-10-CM

## 2021-03-30 DIAGNOSIS — M85851 Other specified disorders of bone density and structure, right thigh: Secondary | ICD-10-CM | POA: Diagnosis not present

## 2021-04-14 DIAGNOSIS — U071 COVID-19: Secondary | ICD-10-CM | POA: Diagnosis not present

## 2021-04-22 ENCOUNTER — Other Ambulatory Visit: Payer: Self-pay | Admitting: Physician Assistant

## 2021-05-01 DIAGNOSIS — R42 Dizziness and giddiness: Secondary | ICD-10-CM | POA: Diagnosis not present

## 2021-05-01 DIAGNOSIS — I10 Essential (primary) hypertension: Secondary | ICD-10-CM | POA: Diagnosis not present

## 2021-05-01 DIAGNOSIS — I4891 Unspecified atrial fibrillation: Secondary | ICD-10-CM | POA: Diagnosis not present

## 2021-05-02 DIAGNOSIS — I4891 Unspecified atrial fibrillation: Secondary | ICD-10-CM | POA: Diagnosis not present

## 2021-05-02 DIAGNOSIS — M9901 Segmental and somatic dysfunction of cervical region: Secondary | ICD-10-CM | POA: Diagnosis not present

## 2021-05-02 DIAGNOSIS — M810 Age-related osteoporosis without current pathological fracture: Secondary | ICD-10-CM | POA: Diagnosis not present

## 2021-05-02 DIAGNOSIS — E7800 Pure hypercholesterolemia, unspecified: Secondary | ICD-10-CM | POA: Diagnosis not present

## 2021-05-02 DIAGNOSIS — R42 Dizziness and giddiness: Secondary | ICD-10-CM | POA: Diagnosis not present

## 2021-05-02 DIAGNOSIS — I10 Essential (primary) hypertension: Secondary | ICD-10-CM | POA: Diagnosis not present

## 2021-05-02 DIAGNOSIS — Z961 Presence of intraocular lens: Secondary | ICD-10-CM | POA: Diagnosis not present

## 2021-05-02 DIAGNOSIS — S138XXA Sprain of joints and ligaments of other parts of neck, initial encounter: Secondary | ICD-10-CM | POA: Diagnosis not present

## 2021-05-16 DIAGNOSIS — H401122 Primary open-angle glaucoma, left eye, moderate stage: Secondary | ICD-10-CM | POA: Diagnosis not present

## 2021-05-16 NOTE — Progress Notes (Signed)
Cardiology Office Note   Date:  05/17/2021   ID:  Stephanie [name], DOB 06-Jan-1924, MRN [id number]  PCP:  Aretta Nip, MD    No chief complaint on file.  PAF  Wt Readings from Last 3 Encounters:  05/17/21 138 lb 9.6 oz (62.9 kg)  11/10/20 142 lb 3.2 oz (64.5 kg)  06/16/20 138 lb 9.6 oz (62.9 kg)       History of Present Illness: Stephanie [name] is a 87 y.o. female   with hypertension, atrial fibrillation and high cholesterol.   She has 8 children, 4 girls and 4 boys.  Closest child is in [place].   In the past, she had concerns about incontinence.  Oxybutynin was suggested and she was instructed to watch for symptoms of lightheadedness and orthostatic hypotension.   In the past, she was staying at her son's house.   She went back home.  She has had both COVID vaccines.   Has had leg edema: "Compression Stockings have been difficult for her.  SHe has been hesitant about using Lasix in the past due to increased urination."   She had an episode of AFib on 04/02/2020: "she was playing cards and got very lightheaded and felt like she was going to pass out. Denies syncope, chest pain, SOB or any other Sx. She was taken to the [facility] clinic by her friend. She states that they did an EKG that showed possible Afib 93 bpm. She was sent to the ER for further evaluation. EKG in ER was NSR. She left before being seen by a provider. She states that she has been completely asymptomatic since the event. BP 135/66 HR 70. "   No AFib since that time.    In 04/07/20, she saw [place].  Eliquis was started.  Metoprolol was increased.  No bleeding problems.    10/21 echo showed: "Left ventricular ejection fraction, by estimation, is 60 to 65%. The  left ventricle has normal function. The left ventricle has no regional  wall motion abnormalities. Left ventricular diastolic parameters are  consistent with Grade II diastolic  dysfunction (pseudonormalization).   2.  Right ventricular systolic function is normal. The right ventricular  size is normal. There is normal pulmonary artery systolic pressure.   3. The mitral valve is degenerative. Mild mitral valve regurgitation. No  evidence of mitral stenosis. Severe mitral annular calcification.   4. Functionally bicuspid with fused right and left cusp . The aortic  valve is calcified. There is moderate calcification of the aortic valve.  There is moderate thickening of the aortic valve. Aortic valve  regurgitation is mild. Moderate aortic valve  stenosis.   5. The inferior vena cava is normal in size with greater than 50%  respiratory variability, suggesting right atrial pressure of 3 mmHg. "   Noted in 2021, in NSR. Eliquis for stroke prevention.  Close to needing only half dose based on weight and age.  Would have a low threshold to decrease dose if there were any issues.    Did PT ands was walking more.  She felt better.  This decreased after PT finished.   She had an episode where she was out of her meds for 3 days. SHe was anxious and felt lightheaded. BP was ok.  SHe did go to an urgent care on 05/01/21, but they let her go home.  No ECG was done per her report.  Vitals were stable.    Past Medical  History:  Diagnosis Date   Aortic stenosis    Echo 10/21: EF 60-65, no RWMA, GR 2 DD, normal RVSF, mild MR, severe MAC, aortic valve moderately calcified, functionally bicuspid with mild AI, moderate AS (mean gradient 22 mmHg, V-max 301 cm/s, DI 0.32)   Atrial fibrillation ([facility])    HTN (hypertension)    Hypercholesteremia    Osteoporosis    Pain in joint     No past surgical history on file.   Current Outpatient Medications  Medication Sig Dispense Refill   alendronate (FOSAMAX) 70 MG tablet once a week.      amoxicillin (AMOXIL) 500 MG capsule For dental procedures     apixaban (ELIQUIS) 2.5 MG TABS tablet Take 1 tablet (2.5 mg total) by mouth 2 (two) times daily. 60 tablet 6   atorvastatin  (LIPITOR) 10 MG tablet Take 5 mg by mouth. 1/2 tablet daily     calcium-vitamin D 250-100 MG-UNIT per tablet Take 1 tablet by mouth daily.      hydrochlorothiazide (HYDRODIURIL) 25 MG tablet Take 25 mg by mouth daily.     latanoprost (XALATAN) 0.005 % ophthalmic solution Place 1 drop into the left eye at bedtime.      metoprolol succinate (TOPROL-XL) 50 MG 24 hr tablet Take 1 tablet (50 mg total) by mouth daily. Please keep upcoming appointment for future refills. Thank you 90 tablet 0   Multiple Vitamins-Minerals (CENTRUM SILVER PO) Take 1 tablet by mouth daily.      tolterodine (DETROL) 2 MG tablet Take 2 mg by mouth daily.     No current facility-administered medications for this visit.    Allergies:   Patient has no known allergies.    Social History:  The patient  reports that she has never smoked. She has never used smokeless tobacco. She reports that she does not drink alcohol and does not use drugs.   Family History:  The patient's Family history is unknown by patient.    ROS:  Please see the history of present illness.   Otherwise, review of systems are positive for ankle edema.   All other systems are reviewed and negative.    PHYSICAL EXAM: VS:  BP 132/70   Pulse 74   Ht _0  (1.499 m)   Wt 138 lb 9.6 oz (62.9 kg)   SpO2 93%   BMI 27.99 kg/m  , BMI Body mass index is 27.99 kg/m. GEN: Well nourished, well developed, in no acute distress HEENT: normal Neck: no JVD, carotid bruits, or masses Cardiac: RRR; 3/6 early systolic murmurs, no rubs, or gallops,; tr ankle edema bilaterally Respiratory:  clear to auscultation bilaterally, normal work of breathing GI: soft, nontender, nondistended, + BS MS: no deformity or atrophy Skin: warm and dry, no rash Neuro:  Strength and sensation are intact Psych: euthymic mood, full affect   EKG:   The ekg ordered today demonstrates NSR, no ST changes   Recent Labs: 11/10/2020: BUN 26; Creatinine, Ser 0.95; Hemoglobin 13.8;  Platelets 178; Potassium 4.7; Sodium 141   Lipid Panel No results found for: CHOL, TRIG, HDL, CHOLHDL, VLDL, LDLCALC, LDLDIRECT   Other studies Reviewed: Additional studies/ records that were reviewed today with results demonstrating: echo in 2021 reviewed, labs from 2022 reviewed.   ASSESSMENT AND PLAN:  Atrial fibrillation: In NSR.  Low-dose Eliquis for stroke prevention to lower bleeding risk. Hypertension: The current medical regimen is effective;  continue present plan and medications. Hyperlipidemia: LDL 55. TG 222.  Whole food, plant-based  diet. Lower extremity edema: Likely some venous insufficiency.  Elevate legs. Moderate aortic stenosis: Functional bicuspid aortic valve noted in 2021.  Given recent lightheadedness, will repeat echocardiogram.  She is quite functional for her age.  She will be celebrating her 77th birthday in a couple of days.  If she does have severe aortic stenosis, will have to discuss with her the extensive work-up involved prior to TAVR.   Current medicines are reviewed at length with the patient today.  The patient concerns regarding her medicines were addressed.  The following changes have been made:  No change  Labs/ tests ordered today include:  No orders of the defined types were placed in this encounter.   Recommend 150 minutes/week of aerobic exercise Low fat, low carb, high fiber diet recommended  Disposition:   FU in 6 months   Signed, Larae Grooms, MD  05/17/2021 9:19 AM    [facility] [address], [place], [place]  [address] Phone: [phone]; Fax: [phone]

## 2021-05-17 ENCOUNTER — Ambulatory Visit: Payer: Medicare HMO | Admitting: Interventional Cardiology

## 2021-05-17 ENCOUNTER — Encounter: Payer: Self-pay | Admitting: Interventional Cardiology

## 2021-05-17 ENCOUNTER — Other Ambulatory Visit (HOSPITAL_COMMUNITY): Payer: Medicare HMO

## 2021-05-17 ENCOUNTER — Other Ambulatory Visit: Payer: Self-pay

## 2021-05-17 VITALS — BP 132/70 | HR 74 | Ht 59.0 in | Wt 138.6 lb

## 2021-05-17 DIAGNOSIS — I480 Paroxysmal atrial fibrillation: Secondary | ICD-10-CM

## 2021-05-17 DIAGNOSIS — R011 Cardiac murmur, unspecified: Secondary | ICD-10-CM | POA: Diagnosis not present

## 2021-05-17 DIAGNOSIS — E7800 Pure hypercholesterolemia, unspecified: Secondary | ICD-10-CM | POA: Diagnosis not present

## 2021-05-17 DIAGNOSIS — R600 Localized edema: Secondary | ICD-10-CM

## 2021-05-17 DIAGNOSIS — I350 Nonrheumatic aortic (valve) stenosis: Secondary | ICD-10-CM | POA: Diagnosis not present

## 2021-05-17 NOTE — Patient Instructions (Signed)
Medication Instructions:  Your physician recommends that you continue on your current medications as directed. Please refer to the Current Medication list given to you today.  *If you need a refill on your cardiac medications before your next appointment, please call your pharmacy*   Lab Work: none If you have labs (blood work) drawn today and your tests are completely normal, you will receive your results only by: MyChart Message (if you have MyChart) OR A paper copy in the mail If you have any lab test that is abnormal or we need to change your treatment, we will call you to review the results.   Testing/Procedures: Your physician has requested that you have an echocardiogram. Echocardiography is a painless test that uses sound waves to create images of your heart. It provides your doctor with information about the size and shape of your heart and how well your heart's chambers and valves are working. This procedure takes approximately one hour. There are no restrictions for this procedure.    Follow-Up: At [facility], you and your health needs are our priority.  As part of our continuing mission to provide you with exceptional heart care, we have created designated Provider Care Teams.  These Care Teams include your primary Cardiologist (physician) and Advanced Practice Providers (APPs -  Physician Assistants and Nurse Practitioners) who all work together to provide you with the care you need, when you need it.  We recommend signing up for the patient portal called "MyChart".  Sign up information is provided on this After Visit Summary.  MyChart is used to connect with patients for Virtual Visits (Telemedicine).  Patients are able to view lab/test results, encounter notes, upcoming appointments, etc.  Non-urgent messages can be sent to your provider as well.   To learn more about what you can do with MyChart, go to [email or web address].    Your next appointment:   6  month(s)  The format for your next appointment:   In Person  Provider:   Lance Muss, MD

## 2021-05-25 DIAGNOSIS — M810 Age-related osteoporosis without current pathological fracture: Secondary | ICD-10-CM | POA: Diagnosis not present

## 2021-05-25 DIAGNOSIS — I4891 Unspecified atrial fibrillation: Secondary | ICD-10-CM | POA: Diagnosis not present

## 2021-05-25 DIAGNOSIS — Z961 Presence of intraocular lens: Secondary | ICD-10-CM | POA: Diagnosis not present

## 2021-05-25 DIAGNOSIS — E7800 Pure hypercholesterolemia, unspecified: Secondary | ICD-10-CM | POA: Diagnosis not present

## 2021-05-25 DIAGNOSIS — I10 Essential (primary) hypertension: Secondary | ICD-10-CM | POA: Diagnosis not present

## 2021-06-09 DIAGNOSIS — H401122 Primary open-angle glaucoma, left eye, moderate stage: Secondary | ICD-10-CM | POA: Diagnosis not present

## 2021-06-10 ENCOUNTER — Other Ambulatory Visit: Payer: Self-pay

## 2021-06-10 ENCOUNTER — Ambulatory Visit (HOSPITAL_COMMUNITY): Payer: Medicare HMO | Attending: Interventional Cardiology

## 2021-06-10 DIAGNOSIS — I350 Nonrheumatic aortic (valve) stenosis: Secondary | ICD-10-CM | POA: Diagnosis not present

## 2021-06-10 LAB — ECHOCARDIOGRAM COMPLETE
AR max vel: 1.08 cm2
AV Area VTI: 1.24 cm2
AV Area mean vel: 1.04 cm2
AV Mean grad: 17 mmHg
AV Peak grad: 31.4 mmHg
Ao pk vel: 2.8 m/s
Area-P 1/2: 3.17 cm2
MV M vel: 5.46 m/s
MV Peak grad: 119.2 mmHg
P 1/2 time: 491 msec
S' Lateral: 1.6 cm

## 2021-07-18 ENCOUNTER — Other Ambulatory Visit: Payer: Self-pay

## 2021-07-18 MED ORDER — APIXABAN 2.5 MG PO TABS: 2.5000 mg | ORAL_TABLET | Freq: Two times a day (BID) | ORAL | 6 refills | Status: DC

## 2021-07-18 MED ORDER — METOPROLOL SUCCINATE ER 50 MG PO TB24: 50.0000 mg | ORAL_TABLET | Freq: Every day | ORAL | 3 refills | Status: DC

## 2021-07-18 NOTE — Telephone Encounter (Signed)
Prescription refill request for Eliquis received. Indication: Afib  Last office visit:05/17/21 Stephanie [name])  Scr: 0.95(11/10/20) Age: 88 Weight: 62.9kg  Per note on 02/01/21, pt is on appropriate dose. Refill sent to requested pharmacy.

## 2021-07-22 DIAGNOSIS — H401122 Primary open-angle glaucoma, left eye, moderate stage: Secondary | ICD-10-CM | POA: Diagnosis not present

## 2021-08-17 DIAGNOSIS — I4891 Unspecified atrial fibrillation: Secondary | ICD-10-CM | POA: Diagnosis not present

## 2021-08-17 DIAGNOSIS — R32 Unspecified urinary incontinence: Secondary | ICD-10-CM | POA: Diagnosis not present

## 2021-08-17 DIAGNOSIS — R29818 Other symptoms and signs involving the nervous system: Secondary | ICD-10-CM | POA: Diagnosis not present

## 2021-08-17 DIAGNOSIS — I10 Essential (primary) hypertension: Secondary | ICD-10-CM | POA: Diagnosis not present

## 2021-08-17 DIAGNOSIS — E7800 Pure hypercholesterolemia, unspecified: Secondary | ICD-10-CM | POA: Diagnosis not present

## 2021-08-17 DIAGNOSIS — M810 Age-related osteoporosis without current pathological fracture: Secondary | ICD-10-CM | POA: Diagnosis not present

## 2021-08-26 DIAGNOSIS — I119 Hypertensive heart disease without heart failure: Secondary | ICD-10-CM | POA: Diagnosis not present

## 2021-08-26 DIAGNOSIS — Z87310 Personal history of (healed) osteoporosis fracture: Secondary | ICD-10-CM | POA: Diagnosis not present

## 2021-08-26 DIAGNOSIS — H02839 Dermatochalasis of unspecified eye, unspecified eyelid: Secondary | ICD-10-CM | POA: Diagnosis not present

## 2021-08-26 DIAGNOSIS — E7800 Pure hypercholesterolemia, unspecified: Secondary | ICD-10-CM | POA: Diagnosis not present

## 2021-08-26 DIAGNOSIS — M810 Age-related osteoporosis without current pathological fracture: Secondary | ICD-10-CM | POA: Diagnosis not present

## 2021-08-26 DIAGNOSIS — I080 Rheumatic disorders of both mitral and aortic valves: Secondary | ICD-10-CM | POA: Diagnosis not present

## 2021-08-26 DIAGNOSIS — I480 Paroxysmal atrial fibrillation: Secondary | ICD-10-CM | POA: Diagnosis not present

## 2021-08-26 DIAGNOSIS — I340 Nonrheumatic mitral (valve) insufficiency: Secondary | ICD-10-CM | POA: Diagnosis not present

## 2021-09-01 DIAGNOSIS — H02839 Dermatochalasis of unspecified eye, unspecified eyelid: Secondary | ICD-10-CM | POA: Diagnosis not present

## 2021-09-01 DIAGNOSIS — I080 Rheumatic disorders of both mitral and aortic valves: Secondary | ICD-10-CM | POA: Diagnosis not present

## 2021-09-01 DIAGNOSIS — I480 Paroxysmal atrial fibrillation: Secondary | ICD-10-CM | POA: Diagnosis not present

## 2021-09-01 DIAGNOSIS — M810 Age-related osteoporosis without current pathological fracture: Secondary | ICD-10-CM | POA: Diagnosis not present

## 2021-09-01 DIAGNOSIS — E7800 Pure hypercholesterolemia, unspecified: Secondary | ICD-10-CM | POA: Diagnosis not present

## 2021-09-01 DIAGNOSIS — I340 Nonrheumatic mitral (valve) insufficiency: Secondary | ICD-10-CM | POA: Diagnosis not present

## 2021-09-01 DIAGNOSIS — Z87310 Personal history of (healed) osteoporosis fracture: Secondary | ICD-10-CM | POA: Diagnosis not present

## 2021-09-01 DIAGNOSIS — I119 Hypertensive heart disease without heart failure: Secondary | ICD-10-CM | POA: Diagnosis not present

## 2021-09-02 DIAGNOSIS — I340 Nonrheumatic mitral (valve) insufficiency: Secondary | ICD-10-CM | POA: Diagnosis not present

## 2021-09-02 DIAGNOSIS — I119 Hypertensive heart disease without heart failure: Secondary | ICD-10-CM | POA: Diagnosis not present

## 2021-09-02 DIAGNOSIS — E785 Hyperlipidemia, unspecified: Secondary | ICD-10-CM | POA: Diagnosis not present

## 2021-09-02 DIAGNOSIS — M1990 Unspecified osteoarthritis, unspecified site: Secondary | ICD-10-CM | POA: Diagnosis not present

## 2021-09-02 DIAGNOSIS — E669 Obesity, unspecified: Secondary | ICD-10-CM | POA: Diagnosis not present

## 2021-09-02 DIAGNOSIS — E7800 Pure hypercholesterolemia, unspecified: Secondary | ICD-10-CM | POA: Diagnosis not present

## 2021-09-02 DIAGNOSIS — Z008 Encounter for other general examination: Secondary | ICD-10-CM | POA: Diagnosis not present

## 2021-09-02 DIAGNOSIS — Z87310 Personal history of (healed) osteoporosis fracture: Secondary | ICD-10-CM | POA: Diagnosis not present

## 2021-09-02 DIAGNOSIS — I4891 Unspecified atrial fibrillation: Secondary | ICD-10-CM | POA: Diagnosis not present

## 2021-09-02 DIAGNOSIS — H409 Unspecified glaucoma: Secondary | ICD-10-CM | POA: Diagnosis not present

## 2021-09-02 DIAGNOSIS — I080 Rheumatic disorders of both mitral and aortic valves: Secondary | ICD-10-CM | POA: Diagnosis not present

## 2021-09-02 DIAGNOSIS — R32 Unspecified urinary incontinence: Secondary | ICD-10-CM | POA: Diagnosis not present

## 2021-09-02 DIAGNOSIS — Z7901 Long term (current) use of anticoagulants: Secondary | ICD-10-CM | POA: Diagnosis not present

## 2021-09-02 DIAGNOSIS — I480 Paroxysmal atrial fibrillation: Secondary | ICD-10-CM | POA: Diagnosis not present

## 2021-09-02 DIAGNOSIS — M810 Age-related osteoporosis without current pathological fracture: Secondary | ICD-10-CM | POA: Diagnosis not present

## 2021-09-02 DIAGNOSIS — H02839 Dermatochalasis of unspecified eye, unspecified eyelid: Secondary | ICD-10-CM | POA: Diagnosis not present

## 2021-09-02 DIAGNOSIS — Z7983 Long term (current) use of bisphosphonates: Secondary | ICD-10-CM | POA: Diagnosis not present

## 2021-09-02 DIAGNOSIS — I10 Essential (primary) hypertension: Secondary | ICD-10-CM | POA: Diagnosis not present

## 2021-09-02 DIAGNOSIS — Z6833 Body mass index (BMI) 33.0-33.9, adult: Secondary | ICD-10-CM | POA: Diagnosis not present

## 2021-09-02 DIAGNOSIS — D6869 Other thrombophilia: Secondary | ICD-10-CM | POA: Diagnosis not present

## 2021-09-07 DIAGNOSIS — Z87310 Personal history of (healed) osteoporosis fracture: Secondary | ICD-10-CM | POA: Diagnosis not present

## 2021-09-07 DIAGNOSIS — I119 Hypertensive heart disease without heart failure: Secondary | ICD-10-CM | POA: Diagnosis not present

## 2021-09-07 DIAGNOSIS — I340 Nonrheumatic mitral (valve) insufficiency: Secondary | ICD-10-CM | POA: Diagnosis not present

## 2021-09-07 DIAGNOSIS — I480 Paroxysmal atrial fibrillation: Secondary | ICD-10-CM | POA: Diagnosis not present

## 2021-09-07 DIAGNOSIS — M810 Age-related osteoporosis without current pathological fracture: Secondary | ICD-10-CM | POA: Diagnosis not present

## 2021-09-07 DIAGNOSIS — H02839 Dermatochalasis of unspecified eye, unspecified eyelid: Secondary | ICD-10-CM | POA: Diagnosis not present

## 2021-09-07 DIAGNOSIS — E7800 Pure hypercholesterolemia, unspecified: Secondary | ICD-10-CM | POA: Diagnosis not present

## 2021-09-07 DIAGNOSIS — I080 Rheumatic disorders of both mitral and aortic valves: Secondary | ICD-10-CM | POA: Diagnosis not present

## 2021-09-08 DIAGNOSIS — H02839 Dermatochalasis of unspecified eye, unspecified eyelid: Secondary | ICD-10-CM | POA: Diagnosis not present

## 2021-09-08 DIAGNOSIS — Z87310 Personal history of (healed) osteoporosis fracture: Secondary | ICD-10-CM | POA: Diagnosis not present

## 2021-09-08 DIAGNOSIS — M810 Age-related osteoporosis without current pathological fracture: Secondary | ICD-10-CM | POA: Diagnosis not present

## 2021-09-08 DIAGNOSIS — I480 Paroxysmal atrial fibrillation: Secondary | ICD-10-CM | POA: Diagnosis not present

## 2021-09-08 DIAGNOSIS — I080 Rheumatic disorders of both mitral and aortic valves: Secondary | ICD-10-CM | POA: Diagnosis not present

## 2021-09-08 DIAGNOSIS — I119 Hypertensive heart disease without heart failure: Secondary | ICD-10-CM | POA: Diagnosis not present

## 2021-09-08 DIAGNOSIS — I340 Nonrheumatic mitral (valve) insufficiency: Secondary | ICD-10-CM | POA: Diagnosis not present

## 2021-09-08 DIAGNOSIS — E7800 Pure hypercholesterolemia, unspecified: Secondary | ICD-10-CM | POA: Diagnosis not present

## 2021-09-12 DIAGNOSIS — R269 Unspecified abnormalities of gait and mobility: Secondary | ICD-10-CM | POA: Diagnosis not present

## 2021-09-12 DIAGNOSIS — I119 Hypertensive heart disease without heart failure: Secondary | ICD-10-CM | POA: Diagnosis not present

## 2021-09-12 DIAGNOSIS — I080 Rheumatic disorders of both mitral and aortic valves: Secondary | ICD-10-CM | POA: Diagnosis not present

## 2021-09-12 DIAGNOSIS — I340 Nonrheumatic mitral (valve) insufficiency: Secondary | ICD-10-CM | POA: Diagnosis not present

## 2021-09-12 DIAGNOSIS — I480 Paroxysmal atrial fibrillation: Secondary | ICD-10-CM | POA: Diagnosis not present

## 2021-09-12 DIAGNOSIS — H02839 Dermatochalasis of unspecified eye, unspecified eyelid: Secondary | ICD-10-CM | POA: Diagnosis not present

## 2021-09-12 DIAGNOSIS — Z87310 Personal history of (healed) osteoporosis fracture: Secondary | ICD-10-CM | POA: Diagnosis not present

## 2021-09-12 DIAGNOSIS — E7800 Pure hypercholesterolemia, unspecified: Secondary | ICD-10-CM | POA: Diagnosis not present

## 2021-09-12 DIAGNOSIS — M810 Age-related osteoporosis without current pathological fracture: Secondary | ICD-10-CM | POA: Diagnosis not present

## 2021-09-15 DIAGNOSIS — I340 Nonrheumatic mitral (valve) insufficiency: Secondary | ICD-10-CM | POA: Diagnosis not present

## 2021-09-15 DIAGNOSIS — I080 Rheumatic disorders of both mitral and aortic valves: Secondary | ICD-10-CM | POA: Diagnosis not present

## 2021-09-15 DIAGNOSIS — Z87310 Personal history of (healed) osteoporosis fracture: Secondary | ICD-10-CM | POA: Diagnosis not present

## 2021-09-15 DIAGNOSIS — I480 Paroxysmal atrial fibrillation: Secondary | ICD-10-CM | POA: Diagnosis not present

## 2021-09-15 DIAGNOSIS — H02839 Dermatochalasis of unspecified eye, unspecified eyelid: Secondary | ICD-10-CM | POA: Diagnosis not present

## 2021-09-15 DIAGNOSIS — E7800 Pure hypercholesterolemia, unspecified: Secondary | ICD-10-CM | POA: Diagnosis not present

## 2021-09-15 DIAGNOSIS — I119 Hypertensive heart disease without heart failure: Secondary | ICD-10-CM | POA: Diagnosis not present

## 2021-09-15 DIAGNOSIS — M810 Age-related osteoporosis without current pathological fracture: Secondary | ICD-10-CM | POA: Diagnosis not present

## 2021-09-23 DIAGNOSIS — I080 Rheumatic disorders of both mitral and aortic valves: Secondary | ICD-10-CM | POA: Diagnosis not present

## 2021-09-23 DIAGNOSIS — I480 Paroxysmal atrial fibrillation: Secondary | ICD-10-CM | POA: Diagnosis not present

## 2021-09-23 DIAGNOSIS — E7800 Pure hypercholesterolemia, unspecified: Secondary | ICD-10-CM | POA: Diagnosis not present

## 2021-09-23 DIAGNOSIS — I119 Hypertensive heart disease without heart failure: Secondary | ICD-10-CM | POA: Diagnosis not present

## 2021-09-23 DIAGNOSIS — Z87310 Personal history of (healed) osteoporosis fracture: Secondary | ICD-10-CM | POA: Diagnosis not present

## 2021-09-23 DIAGNOSIS — H02839 Dermatochalasis of unspecified eye, unspecified eyelid: Secondary | ICD-10-CM | POA: Diagnosis not present

## 2021-09-23 DIAGNOSIS — I340 Nonrheumatic mitral (valve) insufficiency: Secondary | ICD-10-CM | POA: Diagnosis not present

## 2021-09-23 DIAGNOSIS — M810 Age-related osteoporosis without current pathological fracture: Secondary | ICD-10-CM | POA: Diagnosis not present

## 2021-09-28 DIAGNOSIS — I080 Rheumatic disorders of both mitral and aortic valves: Secondary | ICD-10-CM | POA: Diagnosis not present

## 2021-09-28 DIAGNOSIS — I340 Nonrheumatic mitral (valve) insufficiency: Secondary | ICD-10-CM | POA: Diagnosis not present

## 2021-09-28 DIAGNOSIS — M810 Age-related osteoporosis without current pathological fracture: Secondary | ICD-10-CM | POA: Diagnosis not present

## 2021-09-28 DIAGNOSIS — Z87310 Personal history of (healed) osteoporosis fracture: Secondary | ICD-10-CM | POA: Diagnosis not present

## 2021-09-28 DIAGNOSIS — E7800 Pure hypercholesterolemia, unspecified: Secondary | ICD-10-CM | POA: Diagnosis not present

## 2021-09-28 DIAGNOSIS — I119 Hypertensive heart disease without heart failure: Secondary | ICD-10-CM | POA: Diagnosis not present

## 2021-09-28 DIAGNOSIS — H02839 Dermatochalasis of unspecified eye, unspecified eyelid: Secondary | ICD-10-CM | POA: Diagnosis not present

## 2021-09-28 DIAGNOSIS — I480 Paroxysmal atrial fibrillation: Secondary | ICD-10-CM | POA: Diagnosis not present

## 2021-10-01 DIAGNOSIS — L309 Dermatitis, unspecified: Secondary | ICD-10-CM | POA: Diagnosis not present

## 2021-10-04 DIAGNOSIS — I119 Hypertensive heart disease without heart failure: Secondary | ICD-10-CM | POA: Diagnosis not present

## 2021-10-04 DIAGNOSIS — Z87310 Personal history of (healed) osteoporosis fracture: Secondary | ICD-10-CM | POA: Diagnosis not present

## 2021-10-04 DIAGNOSIS — H02839 Dermatochalasis of unspecified eye, unspecified eyelid: Secondary | ICD-10-CM | POA: Diagnosis not present

## 2021-10-04 DIAGNOSIS — I480 Paroxysmal atrial fibrillation: Secondary | ICD-10-CM | POA: Diagnosis not present

## 2021-10-04 DIAGNOSIS — I340 Nonrheumatic mitral (valve) insufficiency: Secondary | ICD-10-CM | POA: Diagnosis not present

## 2021-10-04 DIAGNOSIS — E7800 Pure hypercholesterolemia, unspecified: Secondary | ICD-10-CM | POA: Diagnosis not present

## 2021-10-04 DIAGNOSIS — I080 Rheumatic disorders of both mitral and aortic valves: Secondary | ICD-10-CM | POA: Diagnosis not present

## 2021-10-04 DIAGNOSIS — M810 Age-related osteoporosis without current pathological fracture: Secondary | ICD-10-CM | POA: Diagnosis not present

## 2021-10-18 DIAGNOSIS — M810 Age-related osteoporosis without current pathological fracture: Secondary | ICD-10-CM | POA: Diagnosis not present

## 2021-10-18 DIAGNOSIS — I10 Essential (primary) hypertension: Secondary | ICD-10-CM | POA: Diagnosis not present

## 2021-10-18 DIAGNOSIS — E7800 Pure hypercholesterolemia, unspecified: Secondary | ICD-10-CM | POA: Diagnosis not present

## 2021-10-18 DIAGNOSIS — I4891 Unspecified atrial fibrillation: Secondary | ICD-10-CM | POA: Diagnosis not present

## 2021-11-16 DIAGNOSIS — H401122 Primary open-angle glaucoma, left eye, moderate stage: Secondary | ICD-10-CM | POA: Diagnosis not present

## 2022-02-13 ENCOUNTER — Other Ambulatory Visit: Payer: Self-pay | Admitting: Interventional Cardiology

## 2022-02-13 NOTE — Telephone Encounter (Signed)
Prescription refill request for Eliquis received. Indication:Afib Last office visit:12/22 Scr:0.8 Age: 88 Weight:62.9 kg  Prescription refilled

## 2022-02-21 DIAGNOSIS — Z0000 Encounter for general adult medical examination without abnormal findings: Secondary | ICD-10-CM | POA: Diagnosis not present

## 2022-02-21 DIAGNOSIS — Z1389 Encounter for screening for other disorder: Secondary | ICD-10-CM | POA: Diagnosis not present

## 2022-03-01 ENCOUNTER — Ambulatory Visit: Payer: Medicare HMO | Attending: Interventional Cardiology | Admitting: Interventional Cardiology

## 2022-03-01 ENCOUNTER — Encounter: Payer: Self-pay | Admitting: Interventional Cardiology

## 2022-03-01 VITALS — BP 120/70 | HR 71 | Ht 59.0 in | Wt 137.0 lb

## 2022-03-01 DIAGNOSIS — I350 Nonrheumatic aortic (valve) stenosis: Secondary | ICD-10-CM | POA: Diagnosis not present

## 2022-03-01 DIAGNOSIS — I10 Essential (primary) hypertension: Secondary | ICD-10-CM

## 2022-03-01 DIAGNOSIS — E7800 Pure hypercholesterolemia, unspecified: Secondary | ICD-10-CM

## 2022-03-01 DIAGNOSIS — I480 Paroxysmal atrial fibrillation: Secondary | ICD-10-CM

## 2022-03-01 DIAGNOSIS — R600 Localized edema: Secondary | ICD-10-CM

## 2022-03-01 NOTE — Progress Notes (Signed)
Cardiology Office Note   Date:  03/01/2022   ID:  Stephanie [name], DOB 11-Oct-1923, MRN [id number]  PCP:  Stephanie Congress, NP    No chief complaint on file.  PAF  Wt Readings from Last 3 Encounters:  03/01/22 137 lb (62.1 kg)  05/17/21 138 lb 9.6 oz (62.9 kg)  11/10/20 142 lb 3.2 oz (64.5 kg)       History of Present Illness: Stephanie [name] is a 88 y.o. female  with hypertension, atrial fibrillation and high cholesterol.   She has 8 children, 4 girls and 4 boys.  Closest child is in [place].   In the past, she had concerns about incontinence.  Oxybutynin was suggested and she was instructed to watch for symptoms of lightheadedness and orthostatic hypotension.   In the past, she was staying at her son's house.   She went back home.  She has had both COVID vaccines.   Has had leg edema: "Compression Stockings have been difficult for her.  SHe has been hesitant about using Lasix in the past due to increased urination."   She had an episode of AFib on 04/02/2020: "she was playing cards and got very lightheaded and felt like she was going to pass out. Denies syncope, chest pain, SOB or any other Sx. She was taken to the [facility] clinic by her friend. She states that they did an EKG that showed possible Afib 93 bpm. She was sent to the ER for further evaluation. EKG in ER was NSR. She left before being seen by a provider. She states that she has been completely asymptomatic since the event. BP 135/66 HR 70. "   No AFib since that time.    In 04/07/20, she saw [facility].  Eliquis was started.  Metoprolol was increased.  No bleeding problems.    10/21 echo showed: "Left ventricular ejection fraction, by estimation, is 60 to 65%. The  left ventricle has normal function. The left ventricle has no regional  wall motion abnormalities. Left ventricular diastolic parameters are  consistent with Grade II diastolic  dysfunction (pseudonormalization).   2. Right  ventricular systolic function is normal. The right ventricular  size is normal. There is normal pulmonary artery systolic pressure.   3. The mitral valve is degenerative. Mild mitral valve regurgitation. No  evidence of mitral stenosis. Severe mitral annular calcification.   4. Functionally bicuspid with fused right and left cusp . The aortic  valve is calcified. There is moderate calcification of the aortic valve.  There is moderate thickening of the aortic valve. Aortic valve  regurgitation is mild. Moderate aortic valve  stenosis.   5. The inferior vena cava is normal in size with greater than 50%  respiratory variability, suggesting right atrial pressure of 3 mmHg. "   Noted in 2021, in NSR. Eliquis for stroke prevention.  Close to needing only half dose based on weight and age.  Would have a low threshold to decrease dose if there were any issues.    Did PT ands was walking more.  She felt better.  This decreased after PT finished.    She had an episode where she was out of her meds for 3 days. SHe was anxious and felt lightheaded. BP was ok.  SHe did go to an urgent care on 05/01/21, but they let her go home.  No ECG was done per her report.  Vitals were stable.   Denies : Chest pain. Dizziness. Leg  edema. Nitroglycerin use. Orthopnea. Palpitations. Paroxysmal nocturnal dyspnea. Shortness of breath. Syncope.    Walks in her building.  Plays cards with her neighbors.  Has some numbness in the tips of her fingers.   Past Medical History:  Diagnosis Date   Aortic stenosis    Echo 10/21: EF 60-65, no RWMA, GR 2 DD, normal RVSF, mild MR, severe MAC, aortic valve moderately calcified, functionally bicuspid with mild AI, moderate AS (mean gradient 22 mmHg, V-max 301 cm/s, DI 0.32)   Atrial fibrillation (HCC)    HTN (hypertension)    Hypercholesteremia    Osteoporosis    Pain in joint     No past surgical history on file.   Current Outpatient Medications  Medication Sig Dispense  Refill   alendronate (FOSAMAX) 70 MG tablet once a week.      amoxicillin (AMOXIL) 500 MG capsule For dental procedures     atorvastatin (LIPITOR) 10 MG tablet Take 5 mg by mouth. 1/2 tablet daily     calcium-vitamin D 250-100 MG-UNIT per tablet Take 1 tablet by mouth daily.      ELIQUIS 2.5 MG TABS tablet TAKE ONE TABLET BY MOUTH TWICE A DAY 60 tablet 6   hydrochlorothiazide (HYDRODIURIL) 25 MG tablet Take 25 mg by mouth daily.     latanoprost (XALATAN) 0.005 % ophthalmic solution Place 1 drop into the left eye at bedtime.      metoprolol succinate (TOPROL-XL) 50 MG 24 hr tablet Take 1 tablet (50 mg total) by mouth daily. 90 tablet 3   Multiple Vitamins-Minerals (CENTRUM SILVER PO) Take 1 tablet by mouth daily.      No current facility-administered medications for this visit.    Allergies:   Patient has no known allergies.    Social History:  The patient  reports that she has never smoked. She has never used smokeless tobacco. She reports that she does not drink alcohol and does not use drugs.   Family History:  The patient's Family history is unknown by patient.    ROS:  Please see the history of present illness.   Otherwise, review of systems are positive for numbness in fingers.   All other systems are reviewed and negative.    PHYSICAL EXAM: VS:  BP 120/70 (BP Location: Left Arm, Patient Position: Sitting, Cuff Size: Normal)   Pulse 71   Ht _0  (1.499 m)   Wt 137 lb (62.1 kg)   SpO2 94%   BMI 27.67 kg/m  , BMI Body mass index is 27.67 kg/m. GEN: Well nourished, well developed, in no acute distress HEENT: normal Neck: no JVD, carotid bruits, or masses Cardiac: RRR; 3/6 systolic murmur, no rubs, or gallops,; mild bilateral ankle edema  Respiratory:  clear to auscultation bilaterally, normal work of breathing GI: soft, nontender, nondistended, + BS MS: no deformity or atrophy Skin: warm and dry, no rash Neuro:  Strength and sensation are intact Psych: euthymic mood,  full affect   EKG:   The ekg ordered 05/2021 demonstrates NSR, no ST segment changes   Recent Labs: No results found for requested labs within last 365 days.   Lipid Panel No results found for: "CHOL", "TRIG", "HDL", "CHOLHDL", "VLDL", "LDLCALC", "LDLDIRECT"   Other studies Reviewed: Additional studies/ records that were reviewed today with results demonstrating: LDL 55 in 8/22..   ASSESSMENT AND PLAN:  PAF: Low-dose Eliquis for stroke prevention.  No bleeding problems.  No palpitations or fast heartbeats.  Now in donut hole- medicine is expensive.  Moderate aortic stenosis: functional bicuspid aortic valve. No syncope.   Let us know if any chest pain, fluid retention or, or lightheadedness/Passing out happens.  No sx of severe AS. Hyperlipidemia: Not much of an appetite.   Continue atorvastatin.   LE edema: Elevate legs with ankles above the level of the heart.    Current medicines are reviewed at length with the patient today.  The patient concerns regarding her medicines were addressed.  The following changes have been made:  No change  Labs/ tests ordered today include:  No orders of the defined types were placed in this encounter.   Recommend 150 minutes/week of aerobic exercise Low fat, low carb, high fiber diet recommended  Disposition:   FU in 9 months   Signed, Larae Grooms, MD  03/01/2022 4:01 PM    [facility] [address], [place], [place]  [address] Phone: [phone]; Fax: [phone]

## 2022-03-01 NOTE — Patient Instructions (Signed)
Medication Instructions:  Your physician recommends that you continue on your current medications as directed. Please refer to the Current Medication list given to you today.  *If you need a refill on your cardiac medications before your next appointment, please call your pharmacy*   Lab Work: Lab work to be done today--CBC and BMP If you have labs (blood work) drawn today and your tests are completely normal, you will receive your results only by: MyChart Message (if you have MyChart) OR A paper copy in the mail If you have any lab test that is abnormal or we need to change your treatment, we will call you to review the results.   Testing/Procedures: none   Follow-Up: At [facility], you and your health needs are our priority.  As part of our continuing mission to provide you with exceptional heart care, we have created designated Provider Care Teams.  These Care Teams include your primary Cardiologist (physician) and Advanced Practice Providers (APPs -  Physician Assistants and Nurse Practitioners) who all work together to provide you with the care you need, when you need it.  We recommend signing up for the patient portal called "MyChart".  Sign up information is provided on this After Visit Summary.  MyChart is used to connect with patients for Virtual Visits (Telemedicine).  Patients are able to view lab/test results, encounter notes, upcoming appointments, etc.  Non-urgent messages can be sent to your provider as well.   To learn more about what you can do with MyChart, go to [email or web address].    Your next appointment:   9 month(s)  The format for your next appointment:   In Person  Provider:   Lance Muss, MD     Other Instructions   Important Information About Sugar

## 2022-03-02 LAB — BASIC METABOLIC PANEL
BUN/Creatinine Ratio: 26 (ref 12–28)
BUN: 24 mg/dL (ref 10–36)
CO2: 27 mmol/L (ref 20–29)
Calcium: 9.4 mg/dL (ref 8.7–10.3)
Chloride: 102 mmol/L (ref 96–106)
Creatinine, Ser: 0.92 mg/dL (ref 0.57–1.00)
Glucose: 101 mg/dL — ABNORMAL HIGH (ref 70–99)
Potassium: 4.4 mmol/L (ref 3.5–5.2)
Sodium: 143 mmol/L (ref 134–144)
eGFR: 57 mL/min/{1.73_m2} — ABNORMAL LOW (ref >59)

## 2022-03-02 LAB — CBC
Hematocrit: 37.7 % (ref 34.0–46.6)
Hemoglobin: 12.9 g/dL (ref 11.1–15.9)
MCH: 32.5 pg (ref 26.6–33.0)
MCHC: 34.2 g/dL (ref 31.5–35.7)
MCV: 95 fL (ref 79–97)
Platelets: 169 10*3/uL (ref 150–450)
RBC: 3.97 x10E6/uL (ref 3.77–5.28)
RDW: 11.7 % (ref 11.7–15.4)
WBC: 9.1 10*3/uL (ref 3.4–10.8)

## 2022-04-06 DIAGNOSIS — I10 Essential (primary) hypertension: Secondary | ICD-10-CM | POA: Diagnosis not present

## 2022-04-06 DIAGNOSIS — I4891 Unspecified atrial fibrillation: Secondary | ICD-10-CM | POA: Diagnosis not present

## 2022-04-06 DIAGNOSIS — I340 Nonrheumatic mitral (valve) insufficiency: Secondary | ICD-10-CM | POA: Diagnosis not present

## 2022-04-06 DIAGNOSIS — D6859 Other primary thrombophilia: Secondary | ICD-10-CM | POA: Diagnosis not present

## 2022-04-06 DIAGNOSIS — I700 Atherosclerosis of aorta: Secondary | ICD-10-CM | POA: Diagnosis not present

## 2022-04-06 DIAGNOSIS — E7800 Pure hypercholesterolemia, unspecified: Secondary | ICD-10-CM | POA: Diagnosis not present

## 2022-04-06 DIAGNOSIS — R32 Unspecified urinary incontinence: Secondary | ICD-10-CM | POA: Diagnosis not present

## 2022-04-06 DIAGNOSIS — M810 Age-related osteoporosis without current pathological fracture: Secondary | ICD-10-CM | POA: Diagnosis not present

## 2022-04-06 DIAGNOSIS — Z23 Encounter for immunization: Secondary | ICD-10-CM | POA: Diagnosis not present

## 2022-04-06 DIAGNOSIS — R7303 Prediabetes: Secondary | ICD-10-CM | POA: Diagnosis not present

## 2022-04-06 DIAGNOSIS — I351 Nonrheumatic aortic (valve) insufficiency: Secondary | ICD-10-CM | POA: Diagnosis not present

## 2022-04-06 DIAGNOSIS — Z7901 Long term (current) use of anticoagulants: Secondary | ICD-10-CM | POA: Diagnosis not present

## 2022-05-09 DIAGNOSIS — I10 Essential (primary) hypertension: Secondary | ICD-10-CM | POA: Diagnosis not present

## 2022-05-09 DIAGNOSIS — M810 Age-related osteoporosis without current pathological fracture: Secondary | ICD-10-CM | POA: Diagnosis not present

## 2022-05-09 DIAGNOSIS — I4891 Unspecified atrial fibrillation: Secondary | ICD-10-CM | POA: Diagnosis not present

## 2022-05-09 DIAGNOSIS — E7800 Pure hypercholesterolemia, unspecified: Secondary | ICD-10-CM | POA: Diagnosis not present

## 2022-05-09 DIAGNOSIS — Z961 Presence of intraocular lens: Secondary | ICD-10-CM | POA: Diagnosis not present

## 2022-05-18 DIAGNOSIS — H401123 Primary open-angle glaucoma, left eye, severe stage: Secondary | ICD-10-CM | POA: Diagnosis not present

## 2022-07-11 ENCOUNTER — Other Ambulatory Visit: Payer: Self-pay | Admitting: Interventional Cardiology

## 2022-09-13 ENCOUNTER — Telehealth: Payer: Self-pay | Admitting: *Deleted

## 2022-09-13 NOTE — Telephone Encounter (Signed)
   Pre-operative Risk Assessment    Patient Name: Stephanie [name]  DOB: [birth year] MRN: [id number]     Request for Surgical Clearance    Procedure:  Dental Extraction - Amount of Teeth to be Pulled:  1 TOOTH FOR EXTRACTION  Date of Surgery:  Clearance TBD                                 Surgeon:  DR. Romie Minus, DDS Surgeon's Group or Practice Name:  [facility] [place] Phone number:  [phone] Fax number:  [phone]   Type of Clearance Requested:   - Medical  - Pharmacy:  Hold Apixaban (Eliquis)     Type of Anesthesia:  Local    Additional requests/questions:    [name]   09/13/2022, 6:02 PM

## 2022-09-14 NOTE — Telephone Encounter (Signed)
   Patient Name: Stephanie [name]  DOB: February 27, 1924 MRN: [id number]  Primary Cardiologist: Larae Grooms, MD  Chart reviewed as part of pre-operative protocol coverage.   Simple dental extractions (i.e. 1-2 teeth) are considered low risk procedures per guidelines and generally do not require any specific cardiac clearance. It is also generally accepted that for simple extractions and dental cleanings, there is no need to interrupt blood thinner therapy.   SBE prophylaxis is not required for the patient from a cardiac standpoint.  I will route this recommendation to the requesting party via Epic fax function and remove from pre-op pool.  Please call with questions.  Lenna Sciara, NP 09/14/2022, 1:11 PM

## 2022-09-14 NOTE — Telephone Encounter (Signed)
Resending to pre op as the requesting office send another request today. We only received the 1st late yesterday.

## 2022-09-18 ENCOUNTER — Other Ambulatory Visit: Payer: Self-pay | Admitting: Interventional Cardiology

## 2022-09-18 DIAGNOSIS — I480 Paroxysmal atrial fibrillation: Secondary | ICD-10-CM

## 2022-09-18 NOTE — Telephone Encounter (Signed)
Eliquis 2.5mg  refill request received. Patient is 89 years old, weight-62.1kg, Crea-0.92 on 03/01/22, Diagnosis-per 03/01/22 note it states: "PAF: Low-dose Eliquis for stroke prevention" and 05/17/2021 note states "Atrial fibrillation: In NSR.  Low-dose Eliquis for stroke prevention to lower bleeding risk", and last seen by Dr. Irish Lack on 03/01/22. Dose is appropriate per MD recommendations from OV notes. Will send in refill to requested pharmacy.

## 2022-10-31 DIAGNOSIS — I700 Atherosclerosis of aorta: Secondary | ICD-10-CM | POA: Diagnosis not present

## 2022-10-31 DIAGNOSIS — E785 Hyperlipidemia, unspecified: Secondary | ICD-10-CM | POA: Diagnosis not present

## 2022-10-31 DIAGNOSIS — R32 Unspecified urinary incontinence: Secondary | ICD-10-CM | POA: Diagnosis not present

## 2022-10-31 DIAGNOSIS — M810 Age-related osteoporosis without current pathological fracture: Secondary | ICD-10-CM | POA: Diagnosis not present

## 2022-10-31 DIAGNOSIS — Z7901 Long term (current) use of anticoagulants: Secondary | ICD-10-CM | POA: Diagnosis not present

## 2022-10-31 DIAGNOSIS — D6869 Other thrombophilia: Secondary | ICD-10-CM | POA: Diagnosis not present

## 2022-10-31 DIAGNOSIS — N1831 Chronic kidney disease, stage 3a: Secondary | ICD-10-CM | POA: Diagnosis not present

## 2022-10-31 DIAGNOSIS — I4891 Unspecified atrial fibrillation: Secondary | ICD-10-CM | POA: Diagnosis not present

## 2022-10-31 DIAGNOSIS — M1990 Unspecified osteoarthritis, unspecified site: Secondary | ICD-10-CM | POA: Diagnosis not present

## 2022-10-31 DIAGNOSIS — E669 Obesity, unspecified: Secondary | ICD-10-CM | POA: Diagnosis not present

## 2022-10-31 DIAGNOSIS — H409 Unspecified glaucoma: Secondary | ICD-10-CM | POA: Diagnosis not present

## 2022-10-31 DIAGNOSIS — I129 Hypertensive chronic kidney disease with stage 1 through stage 4 chronic kidney disease, or unspecified chronic kidney disease: Secondary | ICD-10-CM | POA: Diagnosis not present

## 2022-11-22 DIAGNOSIS — Z961 Presence of intraocular lens: Secondary | ICD-10-CM | POA: Diagnosis not present

## 2022-11-22 DIAGNOSIS — H52203 Unspecified astigmatism, bilateral: Secondary | ICD-10-CM | POA: Diagnosis not present

## 2022-11-22 DIAGNOSIS — H349 Unspecified retinal vascular occlusion: Secondary | ICD-10-CM | POA: Diagnosis not present

## 2022-11-22 DIAGNOSIS — H401123 Primary open-angle glaucoma, left eye, severe stage: Secondary | ICD-10-CM | POA: Diagnosis not present

## 2022-12-01 DIAGNOSIS — I4891 Unspecified atrial fibrillation: Secondary | ICD-10-CM | POA: Diagnosis not present

## 2022-12-01 DIAGNOSIS — I10 Essential (primary) hypertension: Secondary | ICD-10-CM | POA: Diagnosis not present

## 2022-12-01 DIAGNOSIS — M810 Age-related osteoporosis without current pathological fracture: Secondary | ICD-10-CM | POA: Diagnosis not present

## 2022-12-01 DIAGNOSIS — E7800 Pure hypercholesterolemia, unspecified: Secondary | ICD-10-CM | POA: Diagnosis not present

## 2022-12-05 DIAGNOSIS — M810 Age-related osteoporosis without current pathological fracture: Secondary | ICD-10-CM | POA: Diagnosis not present

## 2022-12-05 DIAGNOSIS — E7800 Pure hypercholesterolemia, unspecified: Secondary | ICD-10-CM | POA: Diagnosis not present

## 2022-12-05 DIAGNOSIS — I10 Essential (primary) hypertension: Secondary | ICD-10-CM | POA: Diagnosis not present

## 2022-12-05 DIAGNOSIS — I4891 Unspecified atrial fibrillation: Secondary | ICD-10-CM | POA: Diagnosis not present

## 2022-12-15 ENCOUNTER — Emergency Department (HOSPITAL_COMMUNITY)
Admission: EM | Admit: 2022-12-15 | Discharge: 2022-12-15 | Disposition: A | Discharge status: Home / Self Care | Payer: Medicare HMO | Attending: Emergency Medicine | Admitting: Emergency Medicine

## 2022-12-15 ENCOUNTER — Encounter (HOSPITAL_COMMUNITY): Payer: Self-pay

## 2022-12-15 ENCOUNTER — Other Ambulatory Visit: Payer: Self-pay

## 2022-12-15 ENCOUNTER — Emergency Department (HOSPITAL_COMMUNITY): Payer: Medicare HMO

## 2022-12-15 DIAGNOSIS — I4891 Unspecified atrial fibrillation: Secondary | ICD-10-CM | POA: Insufficient documentation

## 2022-12-15 DIAGNOSIS — M542 Cervicalgia: Secondary | ICD-10-CM | POA: Diagnosis not present

## 2022-12-15 DIAGNOSIS — D329 Benign neoplasm of meninges, unspecified: Secondary | ICD-10-CM | POA: Insufficient documentation

## 2022-12-15 DIAGNOSIS — W010XXA Fall on same level from slipping, tripping and stumbling without subsequent striking against object, initial encounter: Secondary | ICD-10-CM | POA: Insufficient documentation

## 2022-12-15 DIAGNOSIS — G4489 Other headache syndrome: Secondary | ICD-10-CM | POA: Diagnosis not present

## 2022-12-15 DIAGNOSIS — Z7901 Long term (current) use of anticoagulants: Secondary | ICD-10-CM | POA: Insufficient documentation

## 2022-12-15 DIAGNOSIS — Z23 Encounter for immunization: Secondary | ICD-10-CM | POA: Insufficient documentation

## 2022-12-15 DIAGNOSIS — R519 Headache, unspecified: Secondary | ICD-10-CM | POA: Insufficient documentation

## 2022-12-15 DIAGNOSIS — S0990XA Unspecified injury of head, initial encounter: Secondary | ICD-10-CM | POA: Diagnosis not present

## 2022-12-15 DIAGNOSIS — S0081XA Abrasion of other part of head, initial encounter: Secondary | ICD-10-CM | POA: Diagnosis not present

## 2022-12-15 DIAGNOSIS — W19XXXA Unspecified fall, initial encounter: Secondary | ICD-10-CM | POA: Diagnosis not present

## 2022-12-15 DIAGNOSIS — Z743 Need for continuous supervision: Secondary | ICD-10-CM | POA: Diagnosis not present

## 2022-12-15 MED ORDER — TETANUS-DIPHTH-ACELL PERTUSSIS 5-2.5-18.5 LF-MCG/0.5 IM SUSY
0.5000 mL | PREFILLED_SYRINGE | Freq: Once | INTRAMUSCULAR | Status: AC
  Administered 2022-12-15: 0.5 mL via INTRAMUSCULAR
  Filled 2022-12-15: qty 0.5

## 2022-12-15 NOTE — Discharge Instructions (Signed)
You were seen in the emergency department for evaluation of injuries from a fall.  You had a CAT scan of your head and neck that did not show any acute traumatic findings.  Radiology did comment upon a stable meningioma in your brain.  You had abrasions of your face that we will need to be kept clean with soap and water.  Please watch for any signs of infection.  Tylenol for pain.  Follow-up with your regular doctor and return to the emergency department if any worsening or concerning symptoms.

## 2022-12-15 NOTE — ED Notes (Signed)
baCITRACIN AND NONADHERING DRESSING APPLIED TO FOREHEAD AND BRIDGE OF NOSE. Pt instructed on home care and expressed verbal understanding

## 2022-12-15 NOTE — ED Triage Notes (Signed)
Pt was at grocery store and had a mechanical fall out of her car. No LOC. Pt arrives in c-collar. Hematoma to left side of forehead. On elliquis for afib .

## 2022-12-15 NOTE — Progress Notes (Signed)
Orthopedic Tech Progress Note Patient Details:  Stephanie [name] [birth year] [id number]  Patient ID: Stephanie [name], female   DOB: 1924-01-17, 89 y.o.   MRN: [id number] Level II; not currently needed. [name] 12/15/2022, 7:22 PM

## 2022-12-15 NOTE — ED Notes (Signed)
Pt ambulated without difficulty. Provider aware.

## 2022-12-15 NOTE — ED Notes (Signed)
Pt transported to CT ?

## 2022-12-15 NOTE — ED Provider Notes (Signed)
[place] EMERGENCY DEPARTMENT AT [facility] Provider Note   CSN: [id number] Arrival date & time: 12/15/22  1834     History {Add pertinent medical, surgical, social history, OB history to HPI:1} Chief Complaint  Patient presents with   Stephanie [name] is a 89 y.o. female.  She is presenting as a level 2 trauma activation fall on thinners.  She was getting out of her car to go into the grocery store when she tripped on the curb and fell struck her head.  She denies any loss of consciousness.  She is complaining of some pain to her forehead.  No neck or back pain no chest pain no shortness of breath no numbness or weakness of extremities.  She is on Eliquis for atrial fibrillation.  She does not know her last tetanus shot.  The history is provided by the patient and the EMS personnel.  Fall This is a new problem. The current episode started less than 1 hour ago. The problem has not changed since onset.Associated symptoms include headaches. Pertinent negatives include no chest pain, no abdominal pain and no shortness of breath. Nothing aggravates the symptoms. Nothing relieves the symptoms. She has tried nothing for the symptoms. The treatment provided no relief.       Home Medications Prior to Admission medications   Medication Sig Start Date End Date Taking? Authorizing Provider  alendronate (FOSAMAX) 70 MG tablet once a week.  03/24/19   [provider]  amoxicillin (AMOXIL) 500 MG capsule For dental procedures 12/30/19   [provider]  apixaban (ELIQUIS) 2.5 MG TABS tablet TAKE 1 TABLET BY MOUTH TWICE A DAY 09/18/22   Corky Crafts, MD  atorvastatin (LIPITOR) 10 MG tablet Take 5 mg by mouth. 1/2 tablet daily    [provider]  calcium-vitamin D 250-100 MG-UNIT per tablet Take 1 tablet by mouth daily.     [provider]  hydrochlorothiazide (HYDRODIURIL) 25 MG tablet Take 25 mg by mouth daily.    [provider]   latanoprost (XALATAN) 0.005 % ophthalmic solution Place 1 drop into the left eye at bedtime.  09/23/19   [provider]  metoprolol succinate (TOPROL-XL) 50 MG 24 hr tablet TAKE ONE TABLET BY MOUTH DAILY 07/11/22   Corky Crafts, MD  Multiple Vitamins-Minerals (CENTRUM SILVER PO) Take 1 tablet by mouth daily.     [provider]      Allergies    Patient has no known allergies.    Review of Systems   Review of Systems  Constitutional:  Negative for fever.  Eyes:  Negative for visual disturbance.  Respiratory:  Negative for shortness of breath.   Cardiovascular:  Negative for chest pain.  Gastrointestinal:  Negative for abdominal pain.  Skin:  Positive for wound.  Neurological:  Positive for headaches.    Physical Exam Updated Vital Signs Ht 5\' 2"  (1.575 m)   Wt 60.8 kg   BMI 24.51 kg/m  Physical Exam Vitals and nursing note reviewed.  Constitutional:      General: She is not in acute distress.    Appearance: Normal appearance. She is well-developed.  HENT:     Head: Normocephalic.     Comments: She has a large abrasion over the middle of her forehead and some smaller abrasions over her nasal bridge Eyes:     Conjunctiva/sclera: Conjunctivae normal.  Neck:     Comments: Cervical collar in place trach midline no stridor  Cardiovascular:     Rate and Rhythm: Normal rate and regular rhythm.     Heart sounds: Murmur heard.  Pulmonary:     Effort: Pulmonary effort is normal. No respiratory distress.     Breath sounds: Normal breath sounds.  Abdominal:     Palpations: Abdomen is soft.     Tenderness: There is no abdominal tenderness. There is no guarding or rebound.  Musculoskeletal:        General: No tenderness or deformity. Normal range of motion.     Cervical back: No tenderness.  Skin:    General: Skin is warm and dry.     Capillary Refill: Capillary refill takes less than 2 seconds.  Neurological:     General: No focal deficit present.      Mental Status: She is alert and oriented to person, place, and time.     Cranial Nerves: No cranial nerve deficit.     Sensory: No sensory deficit.     Motor: No weakness.     ED Results / Procedures / Treatments   Labs (all labs ordered are listed, but only abnormal results are displayed) Labs Reviewed - No data to display  EKG None  Radiology No results found.  Procedures Procedures  {Document cardiac monitor, telemetry assessment procedure when appropriate:1}  Medications Ordered in ED Medications  Tdap (BOOSTRIX) injection 0.5 mL (has no administration in time range)    ED Course/ Medical Decision Making/ A&P   {   Click here for ABCD2, HEART and other calculatorsREFRESH Note before signing :1}                          Medical Decision Making Amount and/or Complexity of Data Reviewed Radiology: ordered.  Risk Prescription drug management.   This patient complains of ***; this involves an extensive number of treatment Options and is a complaint that carries with it a high risk of complications and morbidity. The differential includes ***  I ordered, reviewed and interpreted labs, which included *** I ordered medication *** and reviewed PMP when indicated. I ordered imaging studies which included *** and I independently    visualized and interpreted imaging which showed *** Additional history obtained from *** Previous records obtained and reviewed *** I consulted *** and discussed lab and imaging findings and discussed disposition.  Cardiac monitoring reviewed, *** Social determinants considered, *** Critical Interventions: ***  After the interventions stated above, I reevaluated the patient and found *** Admission and further testing considered, ***   {Document critical care time when appropriate:1} {Document review of labs and clinical decision tools ie heart score, Chads2Vasc2 etc:1}  {Document your independent review of radiology images, and any  outside records:1} {Document your discussion with family members, caretakers, and with consultants:1} {Document social determinants of health affecting pt's care:1} {Document your decision making why or why not admission, treatments were needed:1} Final Clinical Impression(s) / ED Diagnoses Final diagnoses:  None    Rx / DC Orders ED Discharge Orders     None

## 2022-12-19 DIAGNOSIS — I4891 Unspecified atrial fibrillation: Secondary | ICD-10-CM | POA: Diagnosis not present

## 2022-12-19 DIAGNOSIS — Z6830 Body mass index (BMI) 30.0-30.9, adult: Secondary | ICD-10-CM | POA: Diagnosis not present

## 2022-12-19 DIAGNOSIS — D6869 Other thrombophilia: Secondary | ICD-10-CM | POA: Diagnosis not present

## 2022-12-19 DIAGNOSIS — S0081XD Abrasion of other part of head, subsequent encounter: Secondary | ICD-10-CM | POA: Diagnosis not present

## 2022-12-21 DIAGNOSIS — H524 Presbyopia: Secondary | ICD-10-CM | POA: Diagnosis not present

## 2022-12-21 DIAGNOSIS — H52223 Regular astigmatism, bilateral: Secondary | ICD-10-CM | POA: Diagnosis not present

## 2023-01-04 ENCOUNTER — Other Ambulatory Visit: Payer: Self-pay | Admitting: Interventional Cardiology

## 2023-02-11 NOTE — Progress Notes (Unsigned)
Cardiology Office Note:  .   Date:  02/12/2023  ID:  Stephanie [name], DOB 08-May-1924, MRN [id number] PCP: Moshe Cipro, NP  [facility] Providers Cardiologist:  Lance Muss, MD    History of Present Illness: .   Stephanie [name] is a 89 y.o. female with past medical history of paroxysmal atrial, HTN, HLD, lower extremity edema, moderate AS, mild MR.  In 04/2020 she was noted to have 2 episodes of atrial fibrillation with rapid rate.  She had an EKG with her PCP at that time that showed A-fib.  She was started on Eliquis. Initially this was at the 5mg  BID dose but several years ago Dr. Eldridge Dace elected to reduce to 2.5mg  BID for risk reduction - her weight was not quite less than 60kg but he preferred the lower dose as she was close. Her last echocardiogram in 06/2021 showed EF greater than 75% with near cavity obliteration during systole, mild mitral valve regurgitation, moderate tricuspid valve regurgitation, moderate aortic stenosis, and mild aortic valve regurgitation.   She was last seen by Dr. Eldridge Dace in 01/2022 and was felt to be stable from a cardiac persepective.   On 12/15/2022 she presented to the emergency room after a fall.  She was getting out of her car to go to the grocery store when she tripped on the curb and fell and struck her head.  She denied any loss of consciousness.  CT the head indicated no evidence of acute infarction, hemorrhage or hydrocephalus.  There was a densely calcified right cerebral meningioma.  Today she presents for follow up for atrial fibrillation. Reports for the last week she has felt mildly unbalanced and weak, overall just not her normal. 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 She is observed to be ambulating with a steady gait today. She denies chest pain, shortness of breath, orthopnea or pnd. She has chronic lower extremity edema that she reports is at her baseline.  She denies any further falls. She plans to establish with a new PCP on 03/02/23.   ROS: Today she denies chest pain, shortness of breath, palpitations, melena, hematuria, hemoptysis, diaphoresis, presyncope, syncope, orthopnea, and PND.   Studies Reviewed: Marland Kitchen   EKG Interpretation Date/Time:  Monday February 12 2023 10:33:39 EDT Ventricular Rate:  61 PR Interval:  216 QRS Duration:  90 QT Interval:  390 QTC Calculation: 392 R Axis:   46  Text Interpretation: Sinus rhythm with 1st degree A-V block  No ST/Twave changes  Confirmed by Reather Littler [phone]) on 02/12/2023 11:35:25 AM   Cardiac Studies & Procedures       ECHOCARDIOGRAM  ECHOCARDIOGRAM COMPLETE 06/10/2021  Narrative ECHOCARDIOGRAM REPORT    Patient Name:   Stephanie [name] Date of Exam: 06/10/2021 Medical Rec #:  [id number]     Height:       59.0 in Accession #:    [id number]    Weight:       138.6 lb Date of Birth:  Mar 03, 1924    BSA:          1.578 m Patient Age:    89 years      BP:           132/70 mmHg Patient Gender: F             HR:           68 bpm. Exam Location:  [address]  Procedure: 2D  Echo, Cardiac Doppler and Color Doppler  Indications:    I35.0 Nonrheumatic aortic (valve) stenosis  History:        Patient has prior history of Echocardiogram examinations, most recent 04/28/2020. Arrythmias:Atrial Fibrillation, Signs/Symptoms:Murmur and Edema; Risk Factors:Hypertension and Dyslipidemia.  Sonographer:    Cathie Beams RCS Referring Phys: Corky Crafts  IMPRESSIONS   1. LV function is vigorous with near cavity obliteration during systole. . Left ventricular ejection fraction, by estimation, is >75%. The left ventricle has hyperdynamic function. The left ventricle has no regional wall motion abnormalities. Left ventricular diastolic function could not be evaluated. 2. Right ventricular systolic function is normal. The right ventricular size is normal. 3. Mild mitral valve regurgitation. 4.  Tricuspid valve regurgitation is moderate. 5. AV is thickened, calcified with restricted motion. Peak and mean gradients through the valve are 31 and 17 mm Hg respectively AVA (VTI ) is 1.3 cm2. Dimensionless index is 0.41 consistent with moderate AS Compared to previous echo mean gradient now 17 Was 22 mm Hg . Aortic valve regurgitation is mild.  FINDINGS Left Ventricle: LV function is vigorous with near cavity obliteration during systole. Left ventricular ejection fraction, by estimation, is >75%. The left ventricle has hyperdynamic function. The left ventricle has no regional wall motion abnormalities. The left ventricular internal cavity size was small. There is no left ventricular hypertrophy. Left ventricular diastolic function could not be evaluated.  Right Ventricle: The right ventricular size is normal. Right vetricular wall thickness was not assessed. Right ventricular systolic function is normal.  Left Atrium: Left atrial size was normal in size.  Right Atrium: Right atrial size was normal in size.  Pericardium: There is no evidence of pericardial effusion.  Mitral Valve: There is mild thickening of the mitral valve leaflet(s). Mild mitral valve regurgitation.  Tricuspid Valve: The tricuspid valve is normal in structure. Tricuspid valve regurgitation is moderate.  Aortic Valve: AV is thickened, calcified with restricted motion. Peak and mean gradients through the valve are 31 and 17 mm Hg respectively AVA (VTI ) is 1.3 cm2. Dimensionless index is 0.41 consistent with moderate AS Compared to previous echo mean gradient now 17 Was 22 mm Hg. Aortic valve regurgitation is mild. Aortic regurgitation PHT measures 491 msec. Aortic valve mean gradient measures 17.0 mmHg. Aortic valve peak gradient measures 31.4 mmHg. Aortic valve area, by VTI measures 1.24 cm.  Pulmonic Valve: The pulmonic valve was grossly normal. Pulmonic valve regurgitation is mild.  Aorta: The aortic root and  ascending aorta are structurally normal, with no evidence of dilitation.  IAS/Shunts: No atrial level shunt detected by color flow Doppler.   LEFT VENTRICLE PLAX 2D LVIDd:         2.80 cm   Diastology LVIDs:         1.60 cm   LV e' medial:    5.98 cm/s LV PW:         1.10 cm   LV E/e' medial:  14.9 LV IVS:        1.10 cm   LV e' lateral:   8.27 cm/s LVOT diam:     1.95 cm   LV E/e' lateral: 10.8 LV SV:         77 LV SV Index:   49 LVOT Area:     2.99 cm   RIGHT VENTRICLE RV Basal diam:  2.00 cm RV S prime:     13.20 cm/s TAPSE (M-mode): 2.6 cm RVSP:  28.4 mmHg  LEFT ATRIUM             Index        RIGHT ATRIUM           Index LA diam:        2.60 cm 1.65 cm/m   RA Pressure: 3.00 mmHg LA Vol (A2C):   39.4 ml 24.97 ml/m  RA Area:     13.10 cm LA Vol (A4C):   55.3 ml 35.04 ml/m  RA Volume:   24.70 ml  15.65 ml/m LA Biplane Vol: 47.0 ml 29.78 ml/m AORTIC VALVE AV Area (Vmax):    1.08 cm AV Area (Vmean):   1.04 cm AV Area (VTI):     1.24 cm AV Vmax:           280.00 cm/s AV Vmean:          199.000 cm/s AV VTI:            0.626 m AV Peak Grad:      31.4 mmHg AV Mean Grad:      17.0 mmHg LVOT Vmax:         101.00 cm/s LVOT Vmean:        69.200 cm/s LVOT VTI:          0.259 m LVOT/AV VTI ratio: 0.41 AI PHT:            491 msec  AORTA Ao Root diam: 3.10 cm Ao Asc diam:  3.10 cm  MITRAL VALVE                TRICUSPID VALVE MV Area (PHT): 3.17 cm     TR Peak grad:   25.4 mmHg MV Decel Time: 239 msec     TR Vmax:        252.00 cm/s MR Peak grad: 119.2 mmHg    Estimated RAP:  3.00 mmHg MR Mean grad: 48.0 mmHg     RVSP:           28.4 mmHg MR Vmax:      546.00 cm/s MR Vmean:     296.0 cm/s    SHUNTS MV E velocity: 89.10 cm/s   Systemic VTI:  0.26 m MV A velocity: 115.00 cm/s  Systemic Diam: 1.95 cm MV E/A ratio:  0.77  Dietrich Pates MD Electronically signed by Dietrich Pates MD Signature Date/Time: 06/10/2021/5:23:31 PM    Final             Risk  Assessment/Calculations:    CHA2DS2-VASc Score = 4   This indicates a 4.8% annual risk of stroke. The patient's score is based upon: CHF History: 0 HTN History: 1 Diabetes History: 0 Stroke History: 0 Vascular Disease History: 0 Age Score: 2 Gender Score: 1            Physical Exam:   VS:  BP 110/60   Pulse 61   Ht 4\' 11"  (1.499 m)   Wt 132 lb 9.6 oz (60.1 kg)   SpO2 95%   BMI 26.78 kg/m    Wt Readings from Last 3 Encounters:  02/12/23 132 lb 9.6 oz (60.1 kg)  12/15/22 134 lb (60.8 kg)  03/01/22 137 lb (62.1 kg)    GEN: Well nourished, well developed in no acute distress NECK: No JVD; No carotid bruits CARDIAC: RRR, 3/6 systolic murmur, no rubs, gallops RESPIRATORY:  Clear to auscultation without rales, wheezing or rhonchi  ABDOMEN: Soft, non-tender, non-distended EXTREMITIES:  No edema; No deformity  ASSESSMENT AND PLAN: .    PAF/anticoagulation: Remote history of atrial fibrillation. In 04/2020 she had two episodes, symptomatic, self converted to NSR, was started on Eliquis. EKG today indicates NSR with 1st degree AV block, she denies palpitations or sensation of increased heart rate. She denies bleeding problems on Eliquis, she did have a fall in June, notes she tripped, was evaluated in the ED. Denies further falls, if she has further anticoagulation will need to be re-evaluated. CHA2DS2-VASc Score = 4. Historically Dr. Eldridge Dace has recommended lower dose Eliquis 2.5mg  twice daily. With her weight being right at 60kg and age, this seems reasonable. Check CBC and BMET today.   Weakness/fatigue/hypertension: Today she reports mild weakness, fatigue and feeling unbalanced for the last week. She denies any recent illness. Orthostatics are negative. Her blood pressure today was 110/60, notes this is low for her. Given her age goal blood pressure is less than 140/90. There was mention of hyperdynamic LVEF and near cavity obliteration on last echo in 2022. Though not explicitly  commented on in result at the time, she may benefit from decrease in diuretic as above. Decrease hydrochlorothiazide to 12.5mg  daily, if symptoms persist she can stop HCTZ. She will notify if symptoms persist. She also has close f/u with PCP as well. Recommended she stay well hydrated. Check CBC and BMET. If noting ongoing weakness and fatigue can consider repeat echocardiogram.    Aortic stenosis: Noted to be moderate on last echocardiogram in 2022. She denies chest pain or shortness of breath. If symptoms noted in #2 persist can consider repeat echocardiogram. Her other valvular disease (mild AI, mild MR, moderate TR) is appropriate to follow clinically.  Hyperlipidemia: Unable to view last lipid profile and she is not currently fasting. She will be establishing with a new PCP on 03/02/23. Plans to having fasting lipids done with PCP. Continue atorvastatin.   LE edema: She reports chronic lower extremity edema, currently feels she is at her baseline. Continue compression stockings and lower extremity elevation. Follow with reduction in hydrochlorothiazide.        Dispo: Follow up with Ronie Spies, PA in 3 months or sooner if needed.   Signed, Rip Harbour, NP

## 2023-02-12 ENCOUNTER — Ambulatory Visit: Payer: Medicare HMO | Attending: Interventional Cardiology | Admitting: Cardiology

## 2023-02-12 ENCOUNTER — Encounter: Payer: Self-pay | Admitting: Cardiology

## 2023-02-12 VITALS — BP 110/60 | HR 61 | Ht 59.0 in | Wt 132.6 lb

## 2023-02-12 DIAGNOSIS — D6859 Other primary thrombophilia: Secondary | ICD-10-CM

## 2023-02-12 DIAGNOSIS — R531 Weakness: Secondary | ICD-10-CM

## 2023-02-12 DIAGNOSIS — R600 Localized edema: Secondary | ICD-10-CM

## 2023-02-12 DIAGNOSIS — I480 Paroxysmal atrial fibrillation: Secondary | ICD-10-CM

## 2023-02-12 DIAGNOSIS — I10 Essential (primary) hypertension: Secondary | ICD-10-CM

## 2023-02-12 DIAGNOSIS — I350 Nonrheumatic aortic (valve) stenosis: Secondary | ICD-10-CM | POA: Diagnosis not present

## 2023-02-12 DIAGNOSIS — E7800 Pure hypercholesterolemia, unspecified: Secondary | ICD-10-CM

## 2023-02-12 LAB — BASIC METABOLIC PANEL
BUN/Creatinine Ratio: 27 (ref 12–28)
BUN: 24 mg/dL (ref 10–36)
CO2: 27 mmol/L (ref 20–29)
Calcium: 9.6 mg/dL (ref 8.7–10.3)
Chloride: 101 mmol/L (ref 96–106)
Creatinine, Ser: 0.89 mg/dL (ref 0.57–1.00)
Glucose: 94 mg/dL (ref 70–99)
Potassium: 4 mmol/L (ref 3.5–5.2)
Sodium: 141 mmol/L (ref 134–144)
eGFR: 59 mL/min/{1.73_m2} — ABNORMAL LOW (ref >59)

## 2023-02-12 LAB — CBC

## 2023-02-12 NOTE — Patient Instructions (Addendum)
Medication Instructions:  Decrease Hydrochlorothiazide to 12.5 mg daily - If your weakness/ fatigued persist following decreased dose, please stop the hydrochlorothiazide and call our office    *If you need a refill on your cardiac medications before your next appointment, please call your pharmacy*   Lab Work: Cbc, Bmp - today   If you have labs (blood work) drawn today and your tests are completely normal, you will receive your results only by: MyChart Message (if you have MyChart) OR A paper copy in the mail If you have any lab test that is abnormal or we need to change your treatment, we will call you to review the results.   Testing/Procedures: None ordered    Follow-Up: Follow up as scheduled   Other Instructions

## 2023-03-02 DIAGNOSIS — Z9181 History of falling: Secondary | ICD-10-CM | POA: Diagnosis not present

## 2023-03-02 DIAGNOSIS — N1831 Chronic kidney disease, stage 3a: Secondary | ICD-10-CM | POA: Diagnosis not present

## 2023-03-02 DIAGNOSIS — I700 Atherosclerosis of aorta: Secondary | ICD-10-CM | POA: Diagnosis not present

## 2023-03-02 DIAGNOSIS — Z1331 Encounter for screening for depression: Secondary | ICD-10-CM | POA: Diagnosis not present

## 2023-03-02 DIAGNOSIS — Z23 Encounter for immunization: Secondary | ICD-10-CM | POA: Diagnosis not present

## 2023-03-02 DIAGNOSIS — Z0000 Encounter for general adult medical examination without abnormal findings: Secondary | ICD-10-CM | POA: Diagnosis not present

## 2023-03-02 DIAGNOSIS — Z6830 Body mass index (BMI) 30.0-30.9, adult: Secondary | ICD-10-CM | POA: Diagnosis not present

## 2023-03-02 DIAGNOSIS — I771 Stricture of artery: Secondary | ICD-10-CM | POA: Diagnosis not present

## 2023-03-13 ENCOUNTER — Other Ambulatory Visit: Payer: Self-pay | Admitting: Interventional Cardiology

## 2023-03-13 DIAGNOSIS — I480 Paroxysmal atrial fibrillation: Secondary | ICD-10-CM

## 2023-03-13 NOTE — Telephone Encounter (Signed)
Prescription refill request for Eliquis received. Indication: Afib  Last office visit: 02/12/23 Shelva Majestic)  Scr: 0.89 (02/12/23)  Age: 89 Weight: 60.1kg  05/17/2021 note states "Atrial fibrillation: In NSR.  Low-dose Eliquis for stroke prevention to lower bleeding risk", and last seen by Dr. Eldridge Dace on 03/01/22.   Refill sent.

## 2023-04-03 ENCOUNTER — Ambulatory Visit: Payer: Medicare HMO | Admitting: Interventional Cardiology

## 2023-04-11 DIAGNOSIS — I4891 Unspecified atrial fibrillation: Secondary | ICD-10-CM | POA: Diagnosis not present

## 2023-04-11 DIAGNOSIS — I10 Essential (primary) hypertension: Secondary | ICD-10-CM | POA: Diagnosis not present

## 2023-04-11 DIAGNOSIS — M810 Age-related osteoporosis without current pathological fracture: Secondary | ICD-10-CM | POA: Diagnosis not present

## 2023-04-11 DIAGNOSIS — H409 Unspecified glaucoma: Secondary | ICD-10-CM | POA: Diagnosis not present

## 2023-04-11 DIAGNOSIS — Z79899 Other long term (current) drug therapy: Secondary | ICD-10-CM | POA: Diagnosis not present

## 2023-04-11 DIAGNOSIS — E559 Vitamin D deficiency, unspecified: Secondary | ICD-10-CM | POA: Diagnosis not present

## 2023-04-11 DIAGNOSIS — N3281 Overactive bladder: Secondary | ICD-10-CM | POA: Diagnosis not present

## 2023-04-11 DIAGNOSIS — E785 Hyperlipidemia, unspecified: Secondary | ICD-10-CM | POA: Diagnosis not present

## 2023-05-08 ENCOUNTER — Encounter: Payer: Self-pay | Admitting: Physician Assistant

## 2023-05-08 NOTE — Progress Notes (Addendum)
Cardiology Office Note    Date:  05/10/2023  ID:  Stephanie [name], DOB [birth year], MRN [id number] PCP:  Moshe Cipro, FNP  Cardiologist:  Lance Muss, MD  Electrophysiologist:  None   Chief Complaint: f/u medication change  History of Present Illness: .    Stephanie [name] is a 89 y.o. female with visit-pertinent history of paroxysmal atrial fibrillation, first degree AVB, HTN, HLD (patient requested to follow with PCP), hyperdynamic EF, moderate AS, mild MR, chronic LE edema seen for follow-up. In 04/2020 she was diagnosed with atrial fibrillation and started on Eliquis. Initially this was at the 5mg  BID dose but several years ago Dr. Eldridge Dace elected to reduce to 2.5mg  BID for risk reduction - her weight was not quite less than 60kg but he preferred the lower dose as she was close. She did not require cardioversion but has been in NSR in recent visits. Her last echocardiogram in 06/2021 showed EF greater than 75% with near cavity obliteration during systole, mild mitral valve regurgitation, moderate tricuspid valve regurgitation, moderate aortic stenosis, and mild aortic valve regurgitation. She had ED visit 12/2022 for fall while tripping on a curb. CT the head indicated no evidence of acute infarction, hemorrhage or hydrocephalus.  There was a densely calcified right cerebral meningioma. At f/u 02/2023 she reported some vague weakness but was observed to have a steady gait and keeping up with normal activity as usual without other cardiac symptoms. Her orthostatics were negative but BP was 110/60 so hydrochlorothiazide was decreased to 1/2 tablet daily.  She returns for follow-up overall doing well without any chest pain, shortness of breath, residual weakness, dizziness, or syncope. She has noticed a slight increase in her LE edema. However, she also struggles with frequent urination and is on Vesicare. She recently moved to the [facility] facility in [facility]. She is here with her son. They  report her BP is checked there once a week and was 127/74. She has been participating in the offered activities like chair drumming and tai chi.  Initial BP 100/60 by CMA but follow-up orthostatics: Lying P 57 BP 115/64 Sitting P 55 BP 124/64 Standing 80m P 65 BP 140/72 Standing 70m P 65 BP 142/72  Labwork independently reviewed: Son brought in [place] from earlier this week at facility - K 4.2, Cr 0.82, BUN 30, albumin 4.2, Hgb 13.2, plt 137  02/2023 Hgb 12.8, plt 145, K 4.0, Cr 0.89  ROS: .    Please see the history of present illness.  All other systems are reviewed and otherwise negative.  Studies Reviewed: Marland Kitchen    EKG:  EKG is not ordered today  CV Studies: Cardiac studies reviewed are outlined and summarized above. Otherwise please see EMR for full report.   Current Reported Medications:.    Current Meds  Medication Sig   alendronate (FOSAMAX) 70 MG tablet once a week.    amoxicillin (AMOXIL) 500 MG capsule For dental procedures   atorvastatin (LIPITOR) 10 MG tablet Take 5 mg by mouth. 1/2 tablet daily   calcium-vitamin D 250-100 MG-UNIT per tablet Take 1 tablet by mouth daily.    Docusate Sodium (DSS) 100 MG CAPS Take 1 capsule by mouth as needed.   ELIQUIS 2.5 MG TABS tablet TAKE 1 TABLET BY MOUTH 2 TIMES A DAY   hydrochlorothiazide (HYDRODIURIL) 25 MG tablet Take 12.5 mg by mouth daily.   latanoprost (XALATAN) 0.005 % ophthalmic solution Place 1 drop into the left eye at bedtime.  metoprolol succinate (TOPROL-XL) 50 MG 24 hr tablet TAKE 1 TABLET BY MOUTH DAILY   Multiple Vitamins-Minerals (CENTRUM SILVER PO) Take 1 tablet by mouth daily.    solifenacin (VESICARE) 5 MG tablet Take 5 mg by mouth daily.    Physical Exam:    VS:  BP 115/64   Pulse (!) 56   Ht 4\' 11"  (1.499 m)   Wt 138 lb (62.6 kg)   SpO2 96%   BMI 27.87 kg/m    Wt Readings from Last 3 Encounters:  05/10/23 138 lb (62.6 kg)  02/12/23 132 lb 9.6 oz (60.1 kg)  12/15/22 134 lb (60.8 kg)    GEN:  Well nourished, well developed in no acute distress NECK: No JVD; No carotid bruits CARDIAC: RRR, harsh 3/6 SEM with preserved S2, no rubs gallops RESPIRATORY:  Clear to auscultation without rales, wheezing or rhonchi  ABDOMEN: Soft, non-tender, non-distended EXTREMITIES:  Soft puffy BLE edema predominantly at ankles; No acute deformity   Asessement and Plan:.    1. Paroxysmal atrial fibrillation - maintaining NSR on exam and by recent EKG 02/2023. Continue metoprolol as tolerated. She has been managed with lower dose Eliquis by Dr. Eldridge Dace in the setting of her advanced age. With her limited mobility and weight tending to historically fluctuate near 60kg, this seems reasonable. She plans to establish with cardiology closer to [facility], [place]. Would recommend to re-evaluate candidacy for anticoagulation at each OV, but for now she is clinically stable to continue.  2. Chronic LE edema - blood pressure trends this visit and recently outlined above. She reports slightly more prominent, but has been drinking a lot of fluid recently. We discussed limiting fluid to 64oz per day, elevating legs and using compression hose. She will look for the ones with the zipper. I think conservative approach is best without aggressive diuretic titration (beyond chronic low dose hydrochlorothiazide) in the setting of her overactive bladder and mobility. In the future if this becomes worse, can trial low dose Lasix PRN in place of hydrochlorothiazide but do not suspect she'd tolerate chronic dosing.   3. HTN with history of EF greater than 75% with near cavity obliteration during systole, also with history of moderate aortic stenosis, mild AI, mild MR, moderate TR by echo 2022 - blood pressures reviewed above. Initial BP seemed spuriously low compared to other values. She reports recent BP at her facility was 127/74. Would continue low dose hydrochlorothiazide for now for anti-edema effect, but can consider discontinuing  altogether in the future if recurrent low BP occurs. We also discussed updating echocardiogram for her valvular disease vs conservative approach and she will contemplate this. She plans to establish with cardiology closer to her new facility so this can be reviewed at that time.    Disposition: F/u with [facility] as patient plans to establish closer to [facility], [place].  Signed, Laurann Montana, PA-C

## 2023-05-09 DIAGNOSIS — L603 Nail dystrophy: Secondary | ICD-10-CM | POA: Diagnosis not present

## 2023-05-09 DIAGNOSIS — R946 Abnormal results of thyroid function studies: Secondary | ICD-10-CM | POA: Diagnosis not present

## 2023-05-09 DIAGNOSIS — Z79899 Other long term (current) drug therapy: Secondary | ICD-10-CM | POA: Diagnosis not present

## 2023-05-09 DIAGNOSIS — M810 Age-related osteoporosis without current pathological fracture: Secondary | ICD-10-CM | POA: Diagnosis not present

## 2023-05-09 DIAGNOSIS — R1310 Dysphagia, unspecified: Secondary | ICD-10-CM | POA: Diagnosis not present

## 2023-05-09 DIAGNOSIS — I4891 Unspecified atrial fibrillation: Secondary | ICD-10-CM | POA: Diagnosis not present

## 2023-05-09 DIAGNOSIS — H409 Unspecified glaucoma: Secondary | ICD-10-CM | POA: Diagnosis not present

## 2023-05-09 DIAGNOSIS — I10 Essential (primary) hypertension: Secondary | ICD-10-CM | POA: Diagnosis not present

## 2023-05-09 DIAGNOSIS — E785 Hyperlipidemia, unspecified: Secondary | ICD-10-CM | POA: Diagnosis not present

## 2023-05-10 ENCOUNTER — Ambulatory Visit: Payer: Medicare HMO | Attending: Physician Assistant | Admitting: Physician Assistant

## 2023-05-10 ENCOUNTER — Encounter: Payer: Self-pay | Admitting: Physician Assistant

## 2023-05-10 VITALS — BP 115/64 | HR 56 | Ht 59.0 in | Wt 138.0 lb

## 2023-05-10 DIAGNOSIS — R609 Edema, unspecified: Secondary | ICD-10-CM

## 2023-05-10 DIAGNOSIS — I38 Endocarditis, valve unspecified: Secondary | ICD-10-CM | POA: Diagnosis not present

## 2023-05-10 DIAGNOSIS — I480 Paroxysmal atrial fibrillation: Secondary | ICD-10-CM

## 2023-05-10 DIAGNOSIS — I10 Essential (primary) hypertension: Secondary | ICD-10-CM | POA: Diagnosis not present

## 2023-05-10 NOTE — Patient Instructions (Addendum)
Medication Instructions:  Your physician recommends that you continue on your current medications as directed. Please refer to the Current Medication list given to you today. *If you need a refill on your cardiac medications before your next appointment, please call your pharmacy*   Lab Work: NONE ORDERED   Testing/Procedures: NONE ORDERED   Follow-Up: At [facility], you and your health needs are our priority.  As part of our continuing mission to provide you with exceptional heart care, we have created designated Provider Care Teams.  These Care Teams include your primary Cardiologist (physician) and Advanced Practice Providers (APPs -  Physician Assistants and Nurse Practitioners) who all work together to provide you with the care you need, when you need it.  We recommend signing up for the patient portal called "MyChart".  Sign up information is provided on this After Visit Summary.  MyChart is used to connect with patients for Virtual Visits (Telemedicine).  Patients are able to view lab/test results, encounter notes, upcoming appointments, etc.  Non-urgent messages can be sent to your provider as well.   To learn more about what you can do with MyChart, go to [email or web address].    Your next appointment:   AS NEEDED   Provider:   APP  Other Instructions

## 2023-05-23 DIAGNOSIS — H401123 Primary open-angle glaucoma, left eye, severe stage: Secondary | ICD-10-CM | POA: Diagnosis not present

## 2023-06-05 DIAGNOSIS — R946 Abnormal results of thyroid function studies: Secondary | ICD-10-CM | POA: Diagnosis not present

## 2023-06-05 DIAGNOSIS — Z79899 Other long term (current) drug therapy: Secondary | ICD-10-CM | POA: Diagnosis not present

## 2023-06-06 DIAGNOSIS — I4891 Unspecified atrial fibrillation: Secondary | ICD-10-CM | POA: Diagnosis not present

## 2023-06-06 DIAGNOSIS — E039 Hypothyroidism, unspecified: Secondary | ICD-10-CM | POA: Diagnosis not present

## 2023-06-06 DIAGNOSIS — Z79899 Other long term (current) drug therapy: Secondary | ICD-10-CM | POA: Diagnosis not present

## 2023-06-06 DIAGNOSIS — L603 Nail dystrophy: Secondary | ICD-10-CM | POA: Diagnosis not present

## 2023-06-06 DIAGNOSIS — M810 Age-related osteoporosis without current pathological fracture: Secondary | ICD-10-CM | POA: Diagnosis not present

## 2023-06-06 DIAGNOSIS — I10 Essential (primary) hypertension: Secondary | ICD-10-CM | POA: Diagnosis not present

## 2023-06-06 DIAGNOSIS — R1310 Dysphagia, unspecified: Secondary | ICD-10-CM | POA: Diagnosis not present

## 2023-06-12 ENCOUNTER — Telehealth: Payer: Self-pay | Admitting: Physician Assistant

## 2023-06-12 DIAGNOSIS — R1319 Other dysphagia: Secondary | ICD-10-CM | POA: Diagnosis not present

## 2023-06-12 DIAGNOSIS — R1312 Dysphagia, oropharyngeal phase: Secondary | ICD-10-CM | POA: Diagnosis not present

## 2023-06-12 MED ORDER — METOPROLOL SUCCINATE ER 50 MG PO TB24: 50.0000 mg | ORAL_TABLET | Freq: Every day | ORAL | 3 refills | Status: DC

## 2023-06-12 NOTE — Telephone Encounter (Signed)
*  STAT* If patient is at the pharmacy, call can be transferred to refill team.   1. Which medications need to be refilled? (please list name of each medication and dose if known)   metoprolol succinate (TOPROL-XL) 50 MG 24 hr tablet   2. Would you like to learn more about the convenience, safety, & potential cost savings by using the [facility]?   3. Are you open to using the [facility] (Type [facility]. ).  4. Which pharmacy/location (including street and city if local pharmacy) is medication to be sent to?  [facility] - [place], [place] - [place], [place] - [phone]   5. Do they need a 30 day or 90 day supply?   30 day  Caller Marylene Land) stated they will be providing patient's compliance packaging.

## 2023-06-13 DIAGNOSIS — R1312 Dysphagia, oropharyngeal phase: Secondary | ICD-10-CM | POA: Diagnosis not present

## 2023-06-13 DIAGNOSIS — R1319 Other dysphagia: Secondary | ICD-10-CM | POA: Diagnosis not present

## 2023-06-14 DIAGNOSIS — R1319 Other dysphagia: Secondary | ICD-10-CM | POA: Diagnosis not present

## 2023-06-14 DIAGNOSIS — R1312 Dysphagia, oropharyngeal phase: Secondary | ICD-10-CM | POA: Diagnosis not present

## 2023-06-19 DIAGNOSIS — R1319 Other dysphagia: Secondary | ICD-10-CM | POA: Diagnosis not present

## 2023-06-19 DIAGNOSIS — R1312 Dysphagia, oropharyngeal phase: Secondary | ICD-10-CM | POA: Diagnosis not present

## 2023-06-21 DIAGNOSIS — R1312 Dysphagia, oropharyngeal phase: Secondary | ICD-10-CM | POA: Diagnosis not present

## 2023-06-21 DIAGNOSIS — R1319 Other dysphagia: Secondary | ICD-10-CM | POA: Diagnosis not present

## 2023-06-26 DIAGNOSIS — R1319 Other dysphagia: Secondary | ICD-10-CM | POA: Diagnosis not present

## 2023-06-26 DIAGNOSIS — R1312 Dysphagia, oropharyngeal phase: Secondary | ICD-10-CM | POA: Diagnosis not present

## 2023-07-01 ENCOUNTER — Other Ambulatory Visit: Payer: Self-pay | Admitting: Interventional Cardiology

## 2023-07-02 DIAGNOSIS — R1319 Other dysphagia: Secondary | ICD-10-CM | POA: Diagnosis not present

## 2023-07-02 DIAGNOSIS — R1312 Dysphagia, oropharyngeal phase: Secondary | ICD-10-CM | POA: Diagnosis not present

## 2023-07-05 DIAGNOSIS — R1312 Dysphagia, oropharyngeal phase: Secondary | ICD-10-CM | POA: Diagnosis not present

## 2023-07-05 DIAGNOSIS — R1319 Other dysphagia: Secondary | ICD-10-CM | POA: Diagnosis not present

## 2023-07-31 DIAGNOSIS — E039 Hypothyroidism, unspecified: Secondary | ICD-10-CM | POA: Diagnosis not present

## 2023-07-31 DIAGNOSIS — Z79899 Other long term (current) drug therapy: Secondary | ICD-10-CM | POA: Diagnosis not present

## 2023-08-01 DIAGNOSIS — E039 Hypothyroidism, unspecified: Secondary | ICD-10-CM | POA: Diagnosis not present

## 2023-08-01 DIAGNOSIS — R1310 Dysphagia, unspecified: Secondary | ICD-10-CM | POA: Diagnosis not present

## 2023-08-01 DIAGNOSIS — I4891 Unspecified atrial fibrillation: Secondary | ICD-10-CM | POA: Diagnosis not present

## 2023-08-01 DIAGNOSIS — L603 Nail dystrophy: Secondary | ICD-10-CM | POA: Diagnosis not present

## 2023-08-01 DIAGNOSIS — I10 Essential (primary) hypertension: Secondary | ICD-10-CM | POA: Diagnosis not present

## 2023-09-05 DIAGNOSIS — I10 Essential (primary) hypertension: Secondary | ICD-10-CM | POA: Diagnosis not present

## 2023-09-05 DIAGNOSIS — L603 Nail dystrophy: Secondary | ICD-10-CM | POA: Diagnosis not present

## 2023-09-05 DIAGNOSIS — I4891 Unspecified atrial fibrillation: Secondary | ICD-10-CM | POA: Diagnosis not present

## 2023-09-05 DIAGNOSIS — L209 Atopic dermatitis, unspecified: Secondary | ICD-10-CM | POA: Diagnosis not present

## 2023-09-12 DIAGNOSIS — I4891 Unspecified atrial fibrillation: Secondary | ICD-10-CM | POA: Diagnosis not present

## 2023-09-12 DIAGNOSIS — R600 Localized edema: Secondary | ICD-10-CM | POA: Diagnosis not present

## 2023-09-12 DIAGNOSIS — I10 Essential (primary) hypertension: Secondary | ICD-10-CM | POA: Diagnosis not present

## 2023-09-12 DIAGNOSIS — L209 Atopic dermatitis, unspecified: Secondary | ICD-10-CM | POA: Diagnosis not present

## 2023-09-20 DIAGNOSIS — I10 Essential (primary) hypertension: Secondary | ICD-10-CM | POA: Diagnosis not present

## 2023-09-20 DIAGNOSIS — I480 Paroxysmal atrial fibrillation: Secondary | ICD-10-CM | POA: Diagnosis not present

## 2023-09-20 DIAGNOSIS — I350 Nonrheumatic aortic (valve) stenosis: Secondary | ICD-10-CM | POA: Diagnosis not present

## 2023-09-26 DIAGNOSIS — I4891 Unspecified atrial fibrillation: Secondary | ICD-10-CM | POA: Diagnosis not present

## 2023-09-26 DIAGNOSIS — E559 Vitamin D deficiency, unspecified: Secondary | ICD-10-CM | POA: Diagnosis not present

## 2023-09-26 DIAGNOSIS — L209 Atopic dermatitis, unspecified: Secondary | ICD-10-CM | POA: Diagnosis not present

## 2023-09-26 DIAGNOSIS — E039 Hypothyroidism, unspecified: Secondary | ICD-10-CM | POA: Diagnosis not present

## 2023-09-26 DIAGNOSIS — L603 Nail dystrophy: Secondary | ICD-10-CM | POA: Diagnosis not present

## 2023-09-26 DIAGNOSIS — I10 Essential (primary) hypertension: Secondary | ICD-10-CM | POA: Diagnosis not present

## 2023-09-26 DIAGNOSIS — R5383 Other fatigue: Secondary | ICD-10-CM | POA: Diagnosis not present

## 2023-09-26 DIAGNOSIS — Z79899 Other long term (current) drug therapy: Secondary | ICD-10-CM | POA: Diagnosis not present

## 2023-09-28 DIAGNOSIS — I350 Nonrheumatic aortic (valve) stenosis: Secondary | ICD-10-CM | POA: Diagnosis not present

## 2023-10-23 DIAGNOSIS — Z79899 Other long term (current) drug therapy: Secondary | ICD-10-CM | POA: Diagnosis not present

## 2023-10-23 DIAGNOSIS — E559 Vitamin D deficiency, unspecified: Secondary | ICD-10-CM | POA: Diagnosis not present

## 2023-10-24 DIAGNOSIS — I10 Essential (primary) hypertension: Secondary | ICD-10-CM | POA: Diagnosis not present

## 2023-10-24 DIAGNOSIS — R5383 Other fatigue: Secondary | ICD-10-CM | POA: Diagnosis not present

## 2023-10-24 DIAGNOSIS — I4891 Unspecified atrial fibrillation: Secondary | ICD-10-CM | POA: Diagnosis not present

## 2023-10-24 DIAGNOSIS — E039 Hypothyroidism, unspecified: Secondary | ICD-10-CM | POA: Diagnosis not present

## 2023-10-24 DIAGNOSIS — L603 Nail dystrophy: Secondary | ICD-10-CM | POA: Diagnosis not present

## 2023-11-20 DIAGNOSIS — I4891 Unspecified atrial fibrillation: Secondary | ICD-10-CM | POA: Diagnosis not present

## 2023-11-20 DIAGNOSIS — Z7901 Long term (current) use of anticoagulants: Secondary | ICD-10-CM | POA: Diagnosis not present

## 2023-11-20 DIAGNOSIS — R5383 Other fatigue: Secondary | ICD-10-CM | POA: Diagnosis not present

## 2023-11-20 DIAGNOSIS — E039 Hypothyroidism, unspecified: Secondary | ICD-10-CM | POA: Diagnosis not present

## 2023-11-20 DIAGNOSIS — I10 Essential (primary) hypertension: Secondary | ICD-10-CM | POA: Diagnosis not present

## 2023-12-19 DIAGNOSIS — I10 Essential (primary) hypertension: Secondary | ICD-10-CM | POA: Diagnosis not present

## 2023-12-19 DIAGNOSIS — E039 Hypothyroidism, unspecified: Secondary | ICD-10-CM | POA: Diagnosis not present

## 2023-12-19 DIAGNOSIS — L209 Atopic dermatitis, unspecified: Secondary | ICD-10-CM | POA: Diagnosis not present

## 2023-12-19 DIAGNOSIS — I4891 Unspecified atrial fibrillation: Secondary | ICD-10-CM | POA: Diagnosis not present

## 2023-12-19 DIAGNOSIS — R5383 Other fatigue: Secondary | ICD-10-CM | POA: Diagnosis not present

## 2023-12-19 DIAGNOSIS — Z872 Personal history of diseases of the skin and subcutaneous tissue: Secondary | ICD-10-CM | POA: Diagnosis not present

## 2023-12-19 DIAGNOSIS — R600 Localized edema: Secondary | ICD-10-CM | POA: Diagnosis not present

## 2024-02-14 DIAGNOSIS — H02831 Dermatochalasis of right upper eyelid: Secondary | ICD-10-CM | POA: Diagnosis not present

## 2024-02-14 DIAGNOSIS — H401133 Primary open-angle glaucoma, bilateral, severe stage: Secondary | ICD-10-CM | POA: Diagnosis not present

## 2024-02-14 DIAGNOSIS — H02834 Dermatochalasis of left upper eyelid: Secondary | ICD-10-CM | POA: Diagnosis not present

## 2024-02-14 DIAGNOSIS — Z961 Presence of intraocular lens: Secondary | ICD-10-CM | POA: Diagnosis not present

## 2024-02-27 DIAGNOSIS — R5383 Other fatigue: Secondary | ICD-10-CM | POA: Diagnosis not present

## 2024-02-27 DIAGNOSIS — I10 Essential (primary) hypertension: Secondary | ICD-10-CM | POA: Diagnosis not present

## 2024-02-27 DIAGNOSIS — R600 Localized edema: Secondary | ICD-10-CM | POA: Diagnosis not present

## 2024-02-27 DIAGNOSIS — K5900 Constipation, unspecified: Secondary | ICD-10-CM | POA: Diagnosis not present

## 2024-02-27 DIAGNOSIS — E039 Hypothyroidism, unspecified: Secondary | ICD-10-CM | POA: Diagnosis not present

## 2024-02-27 DIAGNOSIS — I482 Chronic atrial fibrillation, unspecified: Secondary | ICD-10-CM | POA: Diagnosis not present

## 2024-03-04 DIAGNOSIS — E559 Vitamin D deficiency, unspecified: Secondary | ICD-10-CM | POA: Diagnosis not present

## 2024-03-27 DIAGNOSIS — E038 Other specified hypothyroidism: Secondary | ICD-10-CM | POA: Diagnosis not present

## 2024-03-27 DIAGNOSIS — I10 Essential (primary) hypertension: Secondary | ICD-10-CM | POA: Diagnosis not present

## 2024-03-27 DIAGNOSIS — R600 Localized edema: Secondary | ICD-10-CM | POA: Diagnosis not present

## 2024-03-27 DIAGNOSIS — R5383 Other fatigue: Secondary | ICD-10-CM | POA: Diagnosis not present

## 2024-03-27 DIAGNOSIS — I482 Chronic atrial fibrillation, unspecified: Secondary | ICD-10-CM | POA: Diagnosis not present

## 2024-04-24 DIAGNOSIS — R600 Localized edema: Secondary | ICD-10-CM | POA: Diagnosis not present

## 2024-04-24 DIAGNOSIS — I10 Essential (primary) hypertension: Secondary | ICD-10-CM | POA: Diagnosis not present

## 2024-04-24 DIAGNOSIS — I482 Chronic atrial fibrillation, unspecified: Secondary | ICD-10-CM | POA: Diagnosis not present

## 2024-04-24 DIAGNOSIS — E785 Hyperlipidemia, unspecified: Secondary | ICD-10-CM | POA: Diagnosis not present

## 2024-04-24 DIAGNOSIS — R5383 Other fatigue: Secondary | ICD-10-CM | POA: Diagnosis not present

## 2024-04-24 DIAGNOSIS — Z7189 Other specified counseling: Secondary | ICD-10-CM | POA: Diagnosis not present

## 2024-05-15 DIAGNOSIS — I10 Essential (primary) hypertension: Secondary | ICD-10-CM | POA: Diagnosis not present

## 2024-05-15 DIAGNOSIS — R5383 Other fatigue: Secondary | ICD-10-CM | POA: Diagnosis not present

## 2024-05-15 DIAGNOSIS — Z7189 Other specified counseling: Secondary | ICD-10-CM | POA: Diagnosis not present

## 2024-05-15 DIAGNOSIS — K5900 Constipation, unspecified: Secondary | ICD-10-CM | POA: Diagnosis not present

## 2024-05-15 DIAGNOSIS — R600 Localized edema: Secondary | ICD-10-CM | POA: Diagnosis not present

## 2024-05-15 DIAGNOSIS — R011 Cardiac murmur, unspecified: Secondary | ICD-10-CM | POA: Diagnosis not present

## 2024-06-10 ENCOUNTER — Other Ambulatory Visit: Payer: Self-pay | Admitting: Physician Assistant

## 2024-06-12 DIAGNOSIS — R5383 Other fatigue: Secondary | ICD-10-CM | POA: Diagnosis not present

## 2024-06-12 DIAGNOSIS — M810 Age-related osteoporosis without current pathological fracture: Secondary | ICD-10-CM | POA: Diagnosis not present

## 2024-06-12 DIAGNOSIS — I10 Essential (primary) hypertension: Secondary | ICD-10-CM | POA: Diagnosis not present

## 2024-06-12 DIAGNOSIS — G4733 Obstructive sleep apnea (adult) (pediatric): Secondary | ICD-10-CM | POA: Diagnosis not present

## 2024-07-17 ENCOUNTER — Other Ambulatory Visit: Payer: Self-pay | Admitting: Physician Assistant
